# Patient Record
Sex: Male | Born: 1965 | Race: White | Hispanic: No | Smoking: Never smoker
Health system: Southern US, Community
[De-identification: ages and names within clinical notes are randomized; demographics above are authoritative.]

## PROBLEM LIST (undated history)

## (undated) DIAGNOSIS — Z5900 Homelessness unspecified: Secondary | ICD-10-CM

## (undated) DIAGNOSIS — E669 Obesity, unspecified: Secondary | ICD-10-CM

## (undated) DIAGNOSIS — M549 Dorsalgia, unspecified: Secondary | ICD-10-CM

## (undated) DIAGNOSIS — J449 Chronic obstructive pulmonary disease, unspecified: Secondary | ICD-10-CM

## (undated) DIAGNOSIS — M1990 Unspecified osteoarthritis, unspecified site: Secondary | ICD-10-CM

## (undated) DIAGNOSIS — F1910 Other psychoactive substance abuse, uncomplicated: Secondary | ICD-10-CM

## (undated) DIAGNOSIS — N200 Calculus of kidney: Secondary | ICD-10-CM

## (undated) DIAGNOSIS — L03116 Cellulitis of left lower limb: Secondary | ICD-10-CM

## (undated) DIAGNOSIS — R7303 Prediabetes: Secondary | ICD-10-CM

---

## 1998-09-22 ENCOUNTER — Emergency Department (HOSPITAL_COMMUNITY): Admission: EM | Admit: 1998-09-22 | Discharge: 1998-09-22 | Payer: Self-pay | Admitting: Emergency Medicine

## 1999-10-07 ENCOUNTER — Emergency Department (HOSPITAL_COMMUNITY): Admission: EM | Admit: 1999-10-07 | Discharge: 1999-10-07 | Payer: Self-pay | Admitting: Emergency Medicine

## 2001-01-07 ENCOUNTER — Emergency Department (HOSPITAL_COMMUNITY): Admission: EM | Admit: 2001-01-07 | Discharge: 2001-01-07 | Payer: Self-pay | Admitting: Emergency Medicine

## 2001-01-07 ENCOUNTER — Encounter: Payer: Self-pay | Admitting: Emergency Medicine

## 2001-01-24 ENCOUNTER — Encounter: Payer: Self-pay | Admitting: Emergency Medicine

## 2001-01-24 ENCOUNTER — Emergency Department (HOSPITAL_COMMUNITY): Admission: EM | Admit: 2001-01-24 | Discharge: 2001-01-24 | Payer: Self-pay | Admitting: Emergency Medicine

## 2001-03-11 ENCOUNTER — Encounter: Payer: Self-pay | Admitting: Emergency Medicine

## 2001-03-11 ENCOUNTER — Emergency Department (HOSPITAL_COMMUNITY): Admission: EM | Admit: 2001-03-11 | Discharge: 2001-03-11 | Payer: Self-pay | Admitting: Emergency Medicine

## 2001-03-14 ENCOUNTER — Emergency Department (HOSPITAL_COMMUNITY): Admission: EM | Admit: 2001-03-14 | Discharge: 2001-03-14 | Payer: Self-pay | Admitting: *Deleted

## 2001-03-20 ENCOUNTER — Encounter: Payer: Self-pay | Admitting: *Deleted

## 2001-03-20 ENCOUNTER — Emergency Department (HOSPITAL_COMMUNITY): Admission: EM | Admit: 2001-03-20 | Discharge: 2001-03-20 | Payer: Self-pay | Admitting: *Deleted

## 2001-04-03 ENCOUNTER — Encounter: Payer: Self-pay | Admitting: Family Medicine

## 2001-04-03 ENCOUNTER — Ambulatory Visit (HOSPITAL_COMMUNITY): Admission: RE | Admit: 2001-04-03 | Discharge: 2001-04-03 | Payer: Self-pay | Admitting: Family Medicine

## 2001-05-12 ENCOUNTER — Emergency Department (HOSPITAL_COMMUNITY): Admission: EM | Admit: 2001-05-12 | Discharge: 2001-05-12 | Payer: Self-pay | Admitting: Emergency Medicine

## 2001-05-12 ENCOUNTER — Encounter: Payer: Self-pay | Admitting: Emergency Medicine

## 2001-06-19 ENCOUNTER — Emergency Department (HOSPITAL_COMMUNITY): Admission: EM | Admit: 2001-06-19 | Discharge: 2001-06-19 | Payer: Self-pay | Admitting: Internal Medicine

## 2001-11-01 ENCOUNTER — Emergency Department (HOSPITAL_COMMUNITY): Admission: EM | Admit: 2001-11-01 | Discharge: 2001-11-01 | Payer: Self-pay | Admitting: *Deleted

## 2001-12-09 ENCOUNTER — Emergency Department (HOSPITAL_COMMUNITY): Admission: EM | Admit: 2001-12-09 | Discharge: 2001-12-09 | Payer: Self-pay | Admitting: Emergency Medicine

## 2001-12-18 ENCOUNTER — Emergency Department (HOSPITAL_COMMUNITY): Admission: EM | Admit: 2001-12-18 | Discharge: 2001-12-18 | Payer: Self-pay | Admitting: Emergency Medicine

## 2002-01-24 ENCOUNTER — Emergency Department (HOSPITAL_COMMUNITY): Admission: EM | Admit: 2002-01-24 | Discharge: 2002-01-24 | Payer: Self-pay | Admitting: Internal Medicine

## 2003-06-10 ENCOUNTER — Inpatient Hospital Stay (HOSPITAL_COMMUNITY): Admission: EM | Admit: 2003-06-10 | Discharge: 2003-06-15 | Payer: Self-pay | Admitting: Emergency Medicine

## 2003-06-10 ENCOUNTER — Encounter: Payer: Self-pay | Admitting: Emergency Medicine

## 2003-09-24 ENCOUNTER — Emergency Department (HOSPITAL_COMMUNITY): Admission: EM | Admit: 2003-09-24 | Discharge: 2003-09-24 | Payer: Self-pay | Admitting: Emergency Medicine

## 2005-07-21 ENCOUNTER — Emergency Department (HOSPITAL_COMMUNITY): Admission: EM | Admit: 2005-07-21 | Discharge: 2005-07-21 | Payer: Self-pay | Admitting: Emergency Medicine

## 2007-04-07 ENCOUNTER — Emergency Department (HOSPITAL_COMMUNITY): Admission: EM | Admit: 2007-04-07 | Discharge: 2007-04-07 | Payer: Self-pay | Admitting: Emergency Medicine

## 2007-08-17 ENCOUNTER — Emergency Department (HOSPITAL_COMMUNITY): Admission: EM | Admit: 2007-08-17 | Discharge: 2007-08-17 | Payer: Self-pay | Admitting: Emergency Medicine

## 2008-02-12 ENCOUNTER — Inpatient Hospital Stay (HOSPITAL_COMMUNITY): Admission: EM | Admit: 2008-02-12 | Discharge: 2008-02-16 | Payer: Self-pay | Admitting: Emergency Medicine

## 2008-07-03 ENCOUNTER — Emergency Department (HOSPITAL_COMMUNITY): Admission: EM | Admit: 2008-07-03 | Discharge: 2008-07-03 | Payer: Self-pay | Admitting: Emergency Medicine

## 2008-08-08 ENCOUNTER — Inpatient Hospital Stay (HOSPITAL_COMMUNITY): Admission: EM | Admit: 2008-08-08 | Discharge: 2008-08-09 | Payer: Self-pay | Admitting: Emergency Medicine

## 2008-09-01 ENCOUNTER — Inpatient Hospital Stay (HOSPITAL_COMMUNITY): Admission: EM | Admit: 2008-09-01 | Discharge: 2008-09-02 | Payer: Self-pay | Admitting: Emergency Medicine

## 2008-10-08 ENCOUNTER — Emergency Department (HOSPITAL_COMMUNITY): Admission: EM | Admit: 2008-10-08 | Discharge: 2008-10-08 | Payer: Self-pay | Admitting: Emergency Medicine

## 2009-01-30 ENCOUNTER — Emergency Department (HOSPITAL_COMMUNITY): Admission: EM | Admit: 2009-01-30 | Discharge: 2009-01-30 | Payer: Self-pay | Admitting: Emergency Medicine

## 2009-06-12 ENCOUNTER — Emergency Department (HOSPITAL_COMMUNITY): Admission: EM | Admit: 2009-06-12 | Discharge: 2009-06-12 | Payer: Self-pay | Admitting: Emergency Medicine

## 2009-07-03 ENCOUNTER — Emergency Department (HOSPITAL_COMMUNITY): Admission: EM | Admit: 2009-07-03 | Discharge: 2009-07-03 | Payer: Self-pay | Admitting: Emergency Medicine

## 2009-07-26 ENCOUNTER — Emergency Department (HOSPITAL_COMMUNITY): Admission: EM | Admit: 2009-07-26 | Discharge: 2009-07-26 | Payer: Self-pay | Admitting: Emergency Medicine

## 2009-08-23 ENCOUNTER — Emergency Department (HOSPITAL_COMMUNITY): Admission: EM | Admit: 2009-08-23 | Discharge: 2009-08-23 | Payer: Self-pay | Admitting: Emergency Medicine

## 2010-03-04 ENCOUNTER — Emergency Department (HOSPITAL_COMMUNITY): Admission: EM | Admit: 2010-03-04 | Discharge: 2010-03-04 | Payer: Self-pay | Admitting: Emergency Medicine

## 2010-10-11 ENCOUNTER — Inpatient Hospital Stay (HOSPITAL_COMMUNITY): Admission: EM | Admit: 2010-10-11 | Discharge: 2010-06-29 | Payer: Self-pay | Admitting: Emergency Medicine

## 2010-11-06 ENCOUNTER — Inpatient Hospital Stay (HOSPITAL_COMMUNITY): Admission: EM | Admit: 2010-11-06 | Discharge: 2010-11-08 | Payer: Self-pay | Source: Home / Self Care

## 2010-11-07 LAB — URINALYSIS, ROUTINE W REFLEX MICROSCOPIC
Bilirubin Urine: NEGATIVE
Hemoglobin, Urine: NEGATIVE
Ketones, ur: NEGATIVE mg/dL
Nitrite: NEGATIVE
Protein, ur: NEGATIVE mg/dL
Specific Gravity, Urine: >1.03 — ABNORMAL HIGH (ref 1.005–1.030)
Urine Glucose, Fasting: NEGATIVE mg/dL
Urobilinogen, UA: 0.2 mg/dL (ref 0.0–1.0)
pH: 5 (ref 5.0–8.0)

## 2010-11-07 LAB — RAPID URINE DRUG SCREEN, HOSP PERFORMED
Amphetamines: NOT DETECTED
Barbiturates: NOT DETECTED
Benzodiazepines: POSITIVE — AB
Cocaine: POSITIVE — AB
Opiates: POSITIVE — AB
Tetrahydrocannabinol: POSITIVE — AB

## 2010-11-07 LAB — VANCOMYCIN, TROUGH: Vancomycin Tr: 8.4 ug/mL — ABNORMAL LOW (ref 10.0–20.0)

## 2010-11-08 LAB — URINE CULTURE
Colony Count: NO GROWTH
Culture  Setup Time: 201201050112
Culture: NO GROWTH
Special Requests: NEGATIVE

## 2010-11-08 LAB — BASIC METABOLIC PANEL
BUN: 16 mg/dL (ref 6–23)
CO2: 25 mEq/L (ref 19–32)
Calcium: 8.4 mg/dL (ref 8.4–10.5)
Chloride: 106 mEq/L (ref 96–112)
Creatinine, Ser: 1.33 mg/dL (ref 0.4–1.5)
GFR calc Af Amer: >60 mL/min (ref >60)
GFR calc non Af Amer: 59 mL/min — ABNORMAL LOW (ref >60)
Glucose, Bld: 123 mg/dL — ABNORMAL HIGH (ref 70–99)
Potassium: 3.8 mEq/L (ref 3.5–5.1)
Sodium: 139 mEq/L (ref 135–145)

## 2010-11-08 LAB — DIFFERENTIAL
Basophils Absolute: 0 10*3/uL (ref 0.0–0.1)
Basophils Relative: 0 % (ref 0–1)
Eosinophils Absolute: 0.3 10*3/uL (ref 0.0–0.7)
Eosinophils Relative: 4 % (ref 0–5)
Lymphocytes Relative: 16 % (ref 12–46)
Lymphs Abs: 0.9 10*3/uL (ref 0.7–4.0)
Monocytes Absolute: 0.7 10*3/uL (ref 0.1–1.0)
Monocytes Relative: 12 % (ref 3–12)
Neutro Abs: 3.8 10*3/uL (ref 1.7–7.7)
Neutrophils Relative %: 67 % (ref 43–77)

## 2010-11-08 LAB — CBC
HCT: 33.7 % — ABNORMAL LOW (ref 39.0–52.0)
Hemoglobin: 11.2 g/dL — ABNORMAL LOW (ref 13.0–17.0)
MCH: 29.8 pg (ref 26.0–34.0)
MCHC: 33.2 g/dL (ref 30.0–36.0)
MCV: 89.6 fL (ref 78.0–100.0)
Platelets: 163 10*3/uL (ref 150–400)
RBC: 3.76 MIL/uL — ABNORMAL LOW (ref 4.22–5.81)
RDW: 14.1 % (ref 11.5–15.5)
WBC: 5.6 10*3/uL (ref 4.0–10.5)

## 2011-01-14 LAB — CULTURE, BLOOD (ROUTINE X 2)
Culture: NO GROWTH
Culture: NO GROWTH

## 2011-01-14 LAB — CBC
HCT: 43.2 % (ref 39.0–52.0)
MCH: 30.2 pg (ref 26.0–34.0)
Platelets: 204 10*3/uL (ref 150–400)
RBC: 4.87 MIL/uL (ref 4.22–5.81)
RDW: 13.6 % (ref 11.5–15.5)

## 2011-01-14 LAB — DIFFERENTIAL
Basophils Absolute: 0 10*3/uL (ref 0.0–0.1)
Basophils Relative: 0 % (ref 0–1)
Lymphocytes Relative: 3 % — ABNORMAL LOW (ref 12–46)
Lymphs Abs: 0.5 10*3/uL — ABNORMAL LOW (ref 0.7–4.0)
Monocytes Absolute: 0.6 10*3/uL (ref 0.1–1.0)
Neutrophils Relative %: 93 % — ABNORMAL HIGH (ref 43–77)

## 2011-01-14 LAB — URINALYSIS, ROUTINE W REFLEX MICROSCOPIC
Hgb urine dipstick: NEGATIVE
Nitrite: NEGATIVE
Specific Gravity, Urine: 1.02 (ref 1.005–1.030)
Urobilinogen, UA: 0.2 mg/dL (ref 0.0–1.0)

## 2011-01-14 LAB — BASIC METABOLIC PANEL
BUN: 13 mg/dL (ref 6–23)
CO2: 28 mEq/L (ref 19–32)
Chloride: 99 mEq/L (ref 96–112)
Creatinine, Ser: 1.33 mg/dL (ref 0.4–1.5)
Glucose, Bld: 99 mg/dL (ref 70–99)

## 2011-01-17 LAB — BASIC METABOLIC PANEL
BUN: 10 mg/dL (ref 6–23)
BUN: 9 mg/dL (ref 6–23)
CO2: 25 mEq/L (ref 19–32)
CO2: 26 mEq/L (ref 19–32)
CO2: 29 mEq/L (ref 19–32)
Calcium: 8.4 mg/dL (ref 8.4–10.5)
Calcium: 8.6 mg/dL (ref 8.4–10.5)
Chloride: 100 mEq/L (ref 96–112)
Chloride: 103 mEq/L (ref 96–112)
Chloride: 104 mEq/L (ref 96–112)
Creatinine, Ser: 1.16 mg/dL (ref 0.4–1.5)
Creatinine, Ser: 1.25 mg/dL (ref 0.4–1.5)
Creatinine, Ser: 1.25 mg/dL (ref 0.4–1.5)
GFR calc Af Amer: >60 mL/min (ref >60)
GFR calc Af Amer: >60 mL/min (ref >60)
GFR calc Af Amer: >60 mL/min (ref >60)
GFR calc non Af Amer: >60 mL/min (ref >60)
Potassium: 3.9 mEq/L (ref 3.5–5.1)
Sodium: 138 mEq/L (ref 135–145)

## 2011-01-17 LAB — CULTURE, BLOOD (ROUTINE X 2)
Culture: NO GROWTH
Culture: NO GROWTH
Report Status: 8262011
Report Status: 8262011

## 2011-01-17 LAB — CBC
Hemoglobin: 11.8 g/dL — ABNORMAL LOW (ref 13.0–17.0)
MCH: 29.9 pg (ref 26.0–34.0)
MCH: 30.2 pg (ref 26.0–34.0)
MCH: 30.5 pg (ref 26.0–34.0)
MCH: 30.7 pg (ref 26.0–34.0)
MCHC: 32.9 g/dL (ref 30.0–36.0)
MCV: 90 fL (ref 78.0–100.0)
MCV: 90.1 fL (ref 78.0–100.0)
MCV: 90.8 fL (ref 78.0–100.0)
MCV: 90.9 fL (ref 78.0–100.0)
Platelets: 149 10*3/uL — ABNORMAL LOW (ref 150–400)
Platelets: 154 10*3/uL (ref 150–400)
Platelets: 193 10*3/uL (ref 150–400)
RBC: 3.66 MIL/uL — ABNORMAL LOW (ref 4.22–5.81)
RBC: 3.86 MIL/uL — ABNORMAL LOW (ref 4.22–5.81)
RBC: 3.88 MIL/uL — ABNORMAL LOW (ref 4.22–5.81)
RDW: 14.2 % (ref 11.5–15.5)
RDW: 14.4 % (ref 11.5–15.5)
WBC: 6.5 10*3/uL (ref 4.0–10.5)
WBC: 8.1 10*3/uL (ref 4.0–10.5)

## 2011-01-17 LAB — DIFFERENTIAL
Basophils Absolute: 0 10*3/uL (ref 0.0–0.1)
Basophils Absolute: 0 10*3/uL (ref 0.0–0.1)
Basophils Relative: 0 % (ref 0–1)
Eosinophils Absolute: 0 10*3/uL (ref 0.0–0.7)
Eosinophils Absolute: 0 10*3/uL (ref 0.0–0.7)
Eosinophils Absolute: 0 10*3/uL (ref 0.0–0.7)
Eosinophils Relative: 0 % (ref 0–5)
Eosinophils Relative: 0 % (ref 0–5)
Eosinophils Relative: 0 % (ref 0–5)
Lymphocytes Relative: 14 % (ref 12–46)
Lymphocytes Relative: 16 % (ref 12–46)
Lymphs Abs: 1 10*3/uL (ref 0.7–4.0)
Lymphs Abs: 1 10*3/uL (ref 0.7–4.0)
Lymphs Abs: 1.1 10*3/uL (ref 0.7–4.0)
Monocytes Absolute: 0.7 10*3/uL (ref 0.1–1.0)
Monocytes Relative: 10 % (ref 3–12)
Neutro Abs: 6 10*3/uL (ref 1.7–7.7)
Neutrophils Relative %: 73 % (ref 43–77)
Neutrophils Relative %: 74 % (ref 43–77)
Neutrophils Relative %: 89 % — ABNORMAL HIGH (ref 43–77)
Neutrophils Relative %: 90 % — ABNORMAL HIGH (ref 43–77)

## 2011-01-17 LAB — LACTIC ACID, PLASMA: Lactic Acid, Venous: 3 mmol/L — ABNORMAL HIGH (ref 0.5–2.2)

## 2011-01-17 LAB — MRSA PCR SCREENING: MRSA by PCR: NEGATIVE

## 2011-01-17 LAB — VANCOMYCIN, TROUGH: Vancomycin Tr: 12.8 ug/mL (ref 10.0–20.0)

## 2011-01-17 LAB — PROCALCITONIN: Procalcitonin: <0.5 ng/mL

## 2011-01-22 LAB — URINALYSIS, ROUTINE W REFLEX MICROSCOPIC
Glucose, UA: NEGATIVE mg/dL
Hgb urine dipstick: NEGATIVE
Ketones, ur: NEGATIVE mg/dL
Protein, ur: NEGATIVE mg/dL

## 2011-02-07 LAB — URINALYSIS, ROUTINE W REFLEX MICROSCOPIC
Glucose, UA: NEGATIVE mg/dL
Ketones, ur: NEGATIVE mg/dL
Leukocytes, UA: NEGATIVE
Specific Gravity, Urine: 1.02 (ref 1.005–1.030)
pH: 5.5 (ref 5.0–8.0)

## 2011-02-07 LAB — URINE MICROSCOPIC-ADD ON

## 2011-02-07 LAB — DIFFERENTIAL
Basophils Absolute: 0 10*3/uL (ref 0.0–0.1)
Lymphocytes Relative: 16 % (ref 12–46)
Lymphs Abs: 1.3 10*3/uL (ref 0.7–4.0)
Neutro Abs: 6.1 10*3/uL (ref 1.7–7.7)
Neutrophils Relative %: 73 % (ref 43–77)

## 2011-02-07 LAB — BASIC METABOLIC PANEL
BUN: 16 mg/dL (ref 6–23)
Calcium: 9.1 mg/dL (ref 8.4–10.5)
Creatinine, Ser: 1.45 mg/dL (ref 0.4–1.5)
GFR calc non Af Amer: 53 mL/min — ABNORMAL LOW (ref >60)
Potassium: 3.8 mEq/L (ref 3.5–5.1)

## 2011-02-07 LAB — CBC
Platelets: 210 10*3/uL (ref 150–400)
RDW: 14.6 % (ref 11.5–15.5)
WBC: 8.4 10*3/uL (ref 4.0–10.5)

## 2011-02-08 LAB — RAPID STREP SCREEN (MED CTR MEBANE ONLY): Streptococcus, Group A Screen (Direct): NEGATIVE

## 2011-02-09 LAB — CULTURE, ROUTINE-ABSCESS: Gram Stain: NONE SEEN

## 2011-02-14 LAB — DIFFERENTIAL
Basophils Relative: 0 % (ref 0–1)
Eosinophils Absolute: 0 10*3/uL (ref 0.0–0.7)
Monocytes Absolute: 0.5 10*3/uL (ref 0.1–1.0)
Monocytes Relative: 8 % (ref 3–12)

## 2011-02-14 LAB — CBC
Hemoglobin: 13.3 g/dL (ref 13.0–17.0)
MCHC: 34.6 g/dL (ref 30.0–36.0)
MCV: 89.4 fL (ref 78.0–100.0)
RBC: 4.29 MIL/uL (ref 4.22–5.81)
WBC: 6.1 10*3/uL (ref 4.0–10.5)

## 2011-02-14 LAB — URINALYSIS, ROUTINE W REFLEX MICROSCOPIC
Glucose, UA: NEGATIVE mg/dL
Ketones, ur: NEGATIVE mg/dL
Leukocytes, UA: NEGATIVE
Protein, ur: NEGATIVE mg/dL
pH: 5.5 (ref 5.0–8.0)

## 2011-02-14 LAB — COMPREHENSIVE METABOLIC PANEL
ALT: 13 U/L (ref 0–53)
AST: 19 U/L (ref 0–37)
Albumin: 3.6 g/dL (ref 3.5–5.2)
Alkaline Phosphatase: 64 U/L (ref 39–117)
Potassium: 4.1 mEq/L (ref 3.5–5.1)
Sodium: 133 mEq/L — ABNORMAL LOW (ref 135–145)
Total Protein: 6.5 g/dL (ref 6.0–8.3)

## 2011-02-14 LAB — URINE MICROSCOPIC-ADD ON

## 2011-03-19 NOTE — H&P (Signed)
Albert Klein, Albert Klein                 ACCOUNT NO.:  [id number]   MEDICAL RECORD NO.:  [id number]          PATIENT TYPE:  INP   LOCATION:  A325                          FACILITY:  [facility]   PHYSICIAN:  Skeet Latch, DO    DATE OF BIRTH:  1966-10-14   DATE OF ADMISSION:  08/08/2008  DATE OF DISCHARGE:  LH                              HISTORY & PHYSICAL   PRIMARY CARE PHYSICIAN:  Dr. Olena Leatherwood.   CHIEF COMPLAINT:  Neck pain.   HISTORY OF PRESENT ILLNESS:  This is a 45 year old who presents with  complaint of neck pain and swelling.  Apparently, the patient states  that he noticed a pimple on the underside of his chin in the middle of  his neck yesterday.  The patient states that he burst the pimple and  then awoke in the middle of the morning, approximately 3 to 3:30 am with  severe pain, swelling, and redness to that area.  The patient states  that the pain is moderate to severe in nature.  The patient denied any  fevers or chills, or any other associated symptoms.  The patient then  states that he came to the emergency room to be evaluated.   PAST MEDICAL HISTORY:  Includes nephrolithiasis, chronic back pain, mild  asthma.   ALLERGIES:  PENICILLIN.  VICODIN/LORTAB/NORCO.   HOME MEDICATIONS:  1. Valium 10 mg twice daily.  2. Zyrtec 10 mg daily.  3. Percocet 10/650 mg 3 times a day.  4. Albuterol inhaler 2 puffs as needed.   SOCIAL HISTORY:  The patient denies any smoking or alcohol.  States that  he has used marijuana in the past.  None currently.   FAMILY HISTORY:  Positive for diabetes.   REVIEW OF SYSTEMS:  GENERAL:  No weight gain, weight loss, fevers or  chills.  HEENT:  Positive for some mid neck swelling, pain, and redness.  RESPIRATORY:  No cough, dyspnea, or wheezing.  CARDIOVASCULAR:  No chest  pain, palpitations.  GENITOURINARY:  No urgency, frequency.  MUSCULOSKELETAL:  Chronic back pain.  Other systems unremarkable.   PHYSICAL EXAM:  VITAL SIGNS:  Temperature is  98, pulse 71, respirations  18, blood pressure 115/66.  CONSTITUTIONAL:  Obese, well-nourished, well-hydrated, in no acute  distress.  HEENT:  Head is normocephalic, atraumatic.  No scleral icterus.  NECK:  Soft.  Does have some fullness in his thyroid area with slight  redness.  This is in the anterior midline of his neck.  ABDOMEN:  Obese, soft, nontender, nondistended.  Positive bowel sounds.  No rigidity or guarding.  EXTREMITIES:  Trace edema bilaterally, left greater than right.  NEURO:  Cranial nerves 2-12 grossly intact.  The patient moves all  extremities.  The patient is alert and oriented x3.   LABS:  Sodium 140, potassium 3.7, chloride 107, CO2 28, glucose 111, BUN  12, creatinine 1.27, white count is 7.5, hemoglobin 13.5, hematocrit  39.3, platelet count 189,000.   ASSESSMENT:  1. Cellulitis of the anterior midline of his neck.  2. History of morbid obesity.  3. History of chronic back  pain.   PLAN:  1. The patient admitted to the service of [facility] to general medical      bed.  2. For cellulitis, the patient has been started on vancomycin IV and      will continue that at this time.  The patient has ultrasound of his      neck, results are pending at this time.  3. Chronic back pain.  The patient will be placed on his home      medications, which include valium and Percocet, and the patient is      on Dilaudid IV.  4. For his obesity, the patient needs counseling regarding diet and      increased activity.  5. The patient will be on DVT as well as GI prophylaxis.      Skeet Latch, DO  Electronically Signed     SM/MEDQ  D:  08/08/2008  T:  08/08/2008  Job:  [id number]   cc:   Lia Hopping  Fax: [phone]

## 2011-03-19 NOTE — Group Therapy Note (Signed)
NAMEPAIGE, VANDERWOUDE                 ACCOUNT NO.:  [id number]   MEDICAL RECORD NO.:  [id number]          PATIENT TYPE:  INP   LOCATION:  A314                          FACILITY:  [facility]   PHYSICIAN:  Lucita Ferrara, MD         DATE OF BIRTH:  Nov 19, 1965   DATE OF PROCEDURE:  02/16/2008  DATE OF DISCHARGE:                                 PROGRESS NOTE   SUBJECTIVE:  The patient is still complaining of some pleuritic type  chest pain.  Still stats that he is wheezing and he shows me some blood-  tinged sputum.  He does not seem to be in any respiratory distress.  Per  nurse's comments and observations the patient seems to be stable on  ambulation.   OBJECTIVE:  The patient's temperature 97.2.  Pulse 59.  Respirations 20.  Blood pressure 137/78.  Pulse ox 96% on room air.  HEENT:  Normocephalic, atraumatic.  No JVD.  Neck supple.  LUNGS:  There is bilaterally expiratory wheezes that are much improved  since yesterday.  CARDIOVASCULAR:  S1 and S2.  Regular rate and rhythm.  No murmurs, rubs,  or clicks.  ABDOMEN:  Soft, nontender.  Not distended.  Positive bowel sounds.  EXTREMITIES:  No clubbing, cyanosis, or edema.  No erythema.  No lower  extremity swelling.   A complete metabolic panel is within normal limits, only with a mildly  low albumin of 2.9.  CBC is normal.  Serum culture is still pending.   ASSESSMENT AND PLAN:  1. Acute bronchitis with blood-tinged sputum and no massive      hemoptysis.  Again it is unlikely that his hemoptysis is secondary      to other worrisome causes such as tuberculosis, malignancy.  In      addition, the patient does not have signs or symptoms of pulmonary      embolism and he is nontachycardic.  He is not hypoxic.  He is to be      currently continued on aspirin and albuterol.  He is empirically      being treated with Levaquin, day #4.  He is currently on prednisone      60 daily.  Plan:  His clinical symptoms do not correlate with his      overall  picture.  He seemed clinically much better.  Will continue      on albuterol and Atrovent.  I am going to go ahead and ambulate him      and get a pulse oximetry in the hall.  Will await PPD results.      Will await sputum culture and cytology.  For completeness we may      decide to repeat a chest x-ray, PA and lateral, given the patient's      symptoms of nonimprovement.  2. Chronic back pain likely secondary to his obesity.  Again, continue      to ambulate the patient.  Get him out of bed.  Will institute      incentive spirometer.  This may also help open up  his airway.  Will      continue on albuterol.  3. Deep venous thrombosis prophylaxis currently with Lovenox 120 subcu      per weight.  Again, this is a pretty high dose.  May induce small      blood-tinged sputum theoretically, yet unlikely.  I feel that the      patient is pretty much ready to be discharged home.  I will go      ahead and put him on p.o. Levaquin today.  He needs to ambulate.      We can get rid of the Lovenox is he is ambulating properly.  He      does not qualify from home O2.      He may benefit from home nebulizer treatments.  Given that he is a      nonsmoker, I suspect that his acute asthmatic bronchitis may be      secondary to allergic etiology.  I will also go ahead and start him      on Singulair 10 mg p.o. nightly.  He should be ready to go home in      the next day or two.      Lucita Ferrara, MD  Electronically Signed     RR/MEDQ  D:  02/16/2008  T:  02/16/2008  Job:  [id number]

## 2011-03-19 NOTE — H&P (Signed)
NAMEJEOFFREY, Albert Klein                 ACCOUNT NO.:  [id number]   MEDICAL RECORD NO.:  [id number]          PATIENT TYPE:  INP   LOCATION:  A314                          FACILITY:  [facility]   PHYSICIAN:  Lucita Ferrara, MD         DATE OF BIRTH:  1966/06/15   DATE OF ADMISSION:  DATE OF DISCHARGE:  LH                              HISTORY & PHYSICAL   Generally speaking the patient feels okay. He is still wheezing. He is  still short of breath. Denies any active chest pain. There is still some  minimal hemoptysis noted.   PHYSICAL EXAMINATION:  VITAL SIGNS:  Vital signs show him to have  temperature of 97.1, and pulse is 59. Respirations are 20, and blood  pressure is 158/76. Pulse-ox is 96% on room air.  HEENT:  Normocephalic, atraumatic. Sclerae anicteric.  NECK:  Supple, no JVD.  LUNGS:  Expiratory wheezes noted in all lung fields.  CARDIOVASCULAR:  S1, S2, regular rate and rhythm. No murmurs, gallops,  or rubs.  ABDOMEN:  Soft, nontender, severely obese.   The patient had a chest x-ray most recently February 12, 2008, which shows  minimal chronic bronchiectatic changes. No acute abnormalities. The  patient has had a CBC with slightly high white count of 10.7, hemoglobin  13.8, hematocrit 40.5. His basic metabolic panel is within normal limits  with the exception of a mildly increased glucose of 152.   ASSESSMENT AND PLAN:  1. Acute bronchitis and hemoptysis which is not massive hemoptysis or      gross hemoptysis. It is most likely secondary to his bronchitis. He      is still wheezing, although he has no resting tachycardia and he is      not hypoxemic. I do not think he has TB as he has no signs and      symptoms of this. Just for completeness, I can get some sputum      cultures on him and sputum cytology, although this may not change      his course. Currently he is on Atrovent, albuterol. He is on      Lovenox 120 subcu q. 24 hours based on weight. He is also on      Atrovent, and he  is getting his day 3 of Levaquin. He is on      prednisone 60 mg daily. I will continue on with that and clinically      evaluate him.  2. Chronic back pain, most likely secondary to his morbid obesity.  3. Hemoptysis. No evidence of neoplasm. Hemoptysis is nonmassive and      mild, consistent with bronchitis. He is      low risk. We will continue to monitor and may consider outpatient      followup with pulmonologist if hemoptysis continues. I do not think      he would be compatible with a CT scan right now.   DISCHARGE DISPOSITION:  We may be able to discharge him in 1-2 days.      Lucita Ferrara, MD  Electronically Signed  RR/MEDQ  D:  02/15/2008  T:  02/15/2008  Job:  [id number]

## 2011-03-19 NOTE — Group Therapy Note (Signed)
NAMEBERTRAN, ZEIMET                 ACCOUNT NO.:  [id number]   MEDICAL RECORD NO.:  [id number]          PATIENT TYPE:  INP   LOCATION:  A314                          FACILITY:  [facility]   PHYSICIAN:  Osvaldo Shipper, MD     DATE OF BIRTH:  [birth year]   DATE OF PROCEDURE:  02/14/2008  DATE OF DISCHARGE:                                 PROGRESS NOTE   Subjectively, the patient feels better, still wheezing a little bit,  still short of breath.   OBJECTIVE:  VITAL SIGNS: Objectively, his vital signs are all stable.  Saturating 93% on room air.Marland Kitchen  LUNGS:  Still has diffuse expiratory wheezing bilaterally.  Though it  has improved .  CARDIOVASCULAR:  S1 and S2. Heart sounds regular.  No murmurs  appreciated.  ABDOMEN:  The patient is moderately obese.  Abdomen is soft.   LABORATORY DATA:  No new findings.   ASSESSMENT/PLAN:  1. Acute bronchitis.  He continues to have slight amount of      hemoptysis.  I have told him that this is secondary to his      bronchitis. Bronchitis is getting better.  In the meantime he is on      oral steroids.  Will change his nebulizers to q.6h. His peakflows      have been improving as well from 250 to 550.  2. Hemoptysis.  Chest x-ray did not show any evidence of a tumor.  If      hemoptysis does not resolve in the next few weeks, he may need to      be seen by a pulmonologist.  3. He has chronic back pain.  He has morbid obesity which are quite      stable.   As the patient continues to improve, I expect discharge tomorrow or the  day after.      Osvaldo Shipper, MD  Electronically Signed     GK/MEDQ  D:  02/14/2008  T:  02/14/2008  Job:  [id number]

## 2011-03-19 NOTE — Discharge Summary (Signed)
Albert Klein, Albert Klein                 ACCOUNT NO.:  [id number]   MEDICAL RECORD NO.:  [id number]          PATIENT TYPE:  INP   LOCATION:  A314                          FACILITY:  [facility]   PHYSICIAN:  Lucita Ferrara, MD         DATE OF BIRTH:  November 16, 1965   DATE OF ADMISSION:  02/12/2008  DATE OF DISCHARGE:  04/14/2009LH                               DISCHARGE SUMMARY   DISCHARGE DIAGNOSES:  1. Acute asthmatic bronchitis exacerbation.  2. Chronic back pain.  3. Morbid obesity.  4. Blood-tinged sputum.   PROCEDURES:  Patient had a chest x-ray dated February 12, 2008, which  showed minimal chronic bronchitic changes.  No acute abnormalities.  Patient also had an ABG done the same day which showed a normal PCO2 of  33.9, pH of 7.46, PAO2 of 70.3.  CBC and CMP within normal limits.   BRIEF HISTORY OF PRESENT ILLNESS AND HOSPITAL COURSE:  Albert Klein is a  morbidly obese male who presented to [name] on February 12, 2008, with cough, wheezing and shortness of breath.  He was  empirically started on nebulizer treatments, antibiotics with  fluoroquinolone, Levaquin, and prednisone for acute bronchitis and not  pneumonia, given no chest x-ray findings.  No fevers, chills or  consolidation.  With above measures, patient's symptoms improved slowly  and he was able to ambulate without any  shortness of breat or chest  pain.  For his hemoptysis that was only blood-tinged sputum, this was  really thought to be secondary to his acute bronchitis picture.  Patient  did not have any fevers, chills, signs of tuberculosis.  Patient also  did not have any resting tachycardia, hypoxemia, signs and symptoms of  pulmonary embolism.  Upon institution of nebulizer treatments, titrating  prednisone and broad spectrum antibiotics, patient's clinical symptoms  resolved.   DISCHARGE MEDICATIONS:  Patient is to go home on preadmission  medications including:  1. Percocet 10/650 every 8 hours as needed for  severe back pain.  2. Valium 10 mg p.o. b.i.d. as needed for muscle spasm.  3. Zyrtec 10 mg p.o. daily.  4. He is also going to be started on Singulair 10 mg p.o. q.h.s.  5. In addition, he will be taking Levaquin 500 mg p.o. daily x5 days.  6. In addition, he is going to go home with a prednisone Dosepak 10 mg      per instructions x6 days.  7. Also, patient is going to be going home with a metered-dose      inhaler, albuterol 5 mg to be used every 4 hours as needed   DISCHARGE CONDITION:  Stable.   DISCHARGE DIET:  Heart-healthy diet.   DISCHARGE ACTIVITIES:  Patient is to resume normal activity.  Increase  activity slowly.  Call with any chest pain or shortness of breath.   FOLLOW UP:  Patient is to follow up with primary care doctor in 48  hours to have his PPD read.  Note:  The patient may need to have a  referral to a pulmonologist in regards  to his blood-tinged sputum if  continues.      Lucita Ferrara, MD  Electronically Signed     RR/MEDQ  D:  02/16/2008  T:  02/16/2008  Job:  [id number]

## 2011-03-19 NOTE — Discharge Summary (Signed)
Albert Klein, Albert Klein                 ACCOUNT NO.:  [id number]   MEDICAL RECORD NO.:  [id number]          PATIENT TYPE:  INP   LOCATION:  A325                          FACILITY:  [facility]   PHYSICIAN:  Skeet Latch, DO    DATE OF BIRTH:  1966/02/04   DATE OF ADMISSION:  08/08/2008  DATE OF DISCHARGE:  10/06/2009LH                               DISCHARGE SUMMARY   DISCHARGE DIAGNOSES:  1. Neck cellulitis.  2. History of morbid obesity.  3. History of chronic back pain.   BRIEF HOSPITAL COURSE:  This is a 45 year old Caucasian male who  presented with neck pain and swelling.  Apparently, the patient noticed  a pimple on the underside of his chin in his anterior neck line 1 day  prior.  He burst the pimple and awoke in the middle of the morning with  severe pain, swelling and redness of the area.  Patient states the pain  was mild to severe, and he decided to come to the emergency room because  he had trouble swallowing.  Patient was seen and admitted for possible  cellulitis and abscess of his anterior midline of his neck.  Patient was  placed on IV antibiotics as well as IV fluids.  Patient was placed on  his home medications for his chronic back pain.  The patient had a chest  x-ray performed that showed no acute changes, showed low lung volumes  without focal airspace disease.  A PICC line was placed.  We were unsure  if patient was going to be on long-term IV antibiotics.  Patient's  initial labs showed white count of 7.5, hemoglobin 13.5, hematocrit  39.3, platelet count 189,000.  His basic metabolic panel was  unremarkable.  On the day of discharge, his white count is 6.8,  hemoglobin 12, hematocrit 35.1, platelet count 150, sodium 139,  potassium 3.6, chloride 105, CO2 of 27, glucose 96, BUN 10, creatinine  1.22.  Patient did undergo a soft tissue ultrasound of his neck which  showed findings compatible with cellulitis, no abscess.  At this time,  we feel patient is stable enough  to be sent home on IV antibiotics for  another 8 days.   MEDICATIONS ON DISCHARGE:  1. Valium 10 mg b.i.d.  2. Zyrtec 10 mg daily.  3. Percocet 10/650 t.i.d.  4. Albuterol 2 puffs inhaler as needed.  5. Vancomycin 1500 mg q.12 h. for 8 days.   CONDITION ON DISCHARGE:  Stable.   DISPOSITION:  The patient will be discharged home.   DISCHARGE INSTRUCTIONS:  The patient will be on low-salt, heart healthy  diet.  He is to keep the cellulitic area clean and dry.  Patient is to  increase activity slowly.  He is to follow up with Dr. Olena Leatherwood in the  next 2 days.   FOLLOWUP:  Patient is to take his medications as directed.  Patient is  to return to the emergency room if he has increasing swelling, pain,  redness or any problems with breathing or swallowing.  I believe patient  understands these instructions.  Again, patient will  be sent home on IV  antibiotics.  Patient does have a PICC line in place which needs to be  removed after his antibiotics are finished in 8 days.  Patient will be  sent home with home health also.      Skeet Latch, DO  Electronically Signed     SM/MEDQ  D:  08/09/2008  T:  08/09/2008  Job:  [id number]   cc:   Lia Hopping  Fax: [phone]

## 2011-03-19 NOTE — Discharge Summary (Signed)
NAMEGOLDEN, GILREATH NO.:  [id number]   MEDICAL RECORD NO.:  [id number]          PATIENT TYPE:  INP   LOCATION:  A311                          FACILITY:  [facility]   PHYSICIAN:  Skeet Latch, DO    DATE OF BIRTH:  May 26, 1966   DATE OF ADMISSION:  09/01/2008  DATE OF DISCHARGE:  10/30/2009LH                               DISCHARGE SUMMARY   DISCHARGE DIAGNOSES:  1. Right ankle/hip pain.  2. History of morbid obesity.  3. Chronic pain.   BRIEF HOSPITAL COURSE:  This 45 year old Caucasian, morbid obese man  presented with right ankle/hip pain status post fall.  The patient was  complaining of right ankle pain prior to this fall.  The patient states  that he lives on the second floor of hotel/motel and has had problems  getting to the hotel room secondary to the pain.  The patient had  multiple x-rays performed on his right hip and ankle.  No acute  fractures were identified.  The patient had physical therapy evaluate  the patient, who states that he will not weight bear on that ankle, but  there is nothing more that they can offer this patient at this time.  The patient was placed on his home medication which includes Percocet  his other home medications.  At this time, Social Services has contacted  the hotel.  The patient can have a first floor room.  The patient is  unwilling to pay the increased rate at this time.  At this time, we feel  the patient could be discharged with followup with his primary care  physician and we will order a wheelchair for the patient at this time.   MEDICATIONS ON DISCHARGE:  1. Percocet 10/650 mg q.6 h. p.r.n. pain.  2. Valium 10 mg twice a day.  3. Zyrtec 10 mg at bedtime.  4. Albuterol inhaler 2 puffs as needed.   Prescription for a large wheelchair will also be written for the  patient.   VITAL SIGNS ON DISCHARGE:  Temperature 98, pulse 76, respirations 16,  blood pressure 141/80, sating 97% on room air.   LABORATORY  DATA:  His uric acid is 6.8, sodium 132, potassium 3.6,  chloride 100, CO2 28, glucose 105, BUN 9, creatinine 1.29, white count  5.8, hemoglobin 12.2, hematocrit 35.3, and platelets 181,000.   CONDITION ON DISCHARGE:  Stable.   DISPOSITION:  Patient is being discharged to home.   DISCHARGE INSTRUCTIONS:  The patient to follow up with, I believe, Dr.  Olena Leatherwood within the next 3-5 days for close followup or in the next  scheduled appointment.  The patient to maintain a low-salt, heart-  healthy diet.  The patient will benefit with some type of weight loss  program and increase his exercise at this time.  The patient will  take his medications as prescribed.  I did speak with the patient  regarding his weight and contributory factor of his weight with his  pain, his chronic pain as well as his right ankle/hip pain.  The patient  may benefit from a more advanced  radiologic study possibly MRI if his  right ankle/foot pain does not improve.      Skeet Latch, DO  Electronically Signed     SM/MEDQ  D:  09/02/2008  T:  09/03/2008  Job:  [id number]   cc:   Lia Hopping  Fax: [phone]

## 2011-03-19 NOTE — H&P (Signed)
NAMEWHITMAN, MEINHARDT NO.:  [id number]   MEDICAL RECORD NO.:  [id number]          PATIENT TYPE:  INP   LOCATION:  A311                          FACILITY:  [facility]   PHYSICIAN:  Skeet Latch, DO    DATE OF BIRTH:  [birth year]   DATE OF ADMISSION:  09/01/2008  DATE OF DISCHARGE:  10/30/2009LH                              HISTORY & PHYSICAL   PRIMARY CARE PHYSICIAN:  Lia Hopping   CHIEF COMPLAINT:  Right upper foot pain.   HISTORY OF PRESENT ILLNESS:  This is a 45 year old morbidly obese  Caucasian male who presents after complaints of a fall.  The patient  states that he had a slight fall, injured his right ankle/foot prior  coming to the emergency room.  The patient was complaining of right  ankle/also right hip pain.  The patient states that his ankle is hurting  prior to his fall which occurred at home.  The patient lost his  consciousness, states the pain was 10/10, and states that the pain was  severe in nature.  The patient has had history of morbid obesity.   PAST MEDICAL HISTORY:  Mild asthma, chronic back pain, morbid obesity,  muscle spasms, kidney stones.   SURGICAL HISTORY:  None.   ALLERGIES:  PENICILLIN, LORTAB/NORCO.   HOME MEDICATIONS:  1. Percocet 10/650 mg 3 times a day.  2. Valium 10 mg twice a day.  3. Zyrtec 10 mg daily.  4. Albuterol inhaler 2 puffs as needed.   SOCIAL HISTORY:  The patient denies any alcohol, illicit drug use other  than marijuana, denies any smoking, alcohol.  He states that he has used  marijuana in the past.   FAMILY HISTORY:  Positive for diabetes.   REVIEW OF SYSTEMS:  GENERAL:  No weight gain, no fevers or chills.  HEENT: Unremarkable.  RESPIRATORY:  Unremarkable.  CARDIOVASCULAR:  Unremarkable.  MUSCULOSKELETAL:  Positive, chronic back pain, and right  ankle/foot pain.  All other systems are unremarkable.   PHYSICAL EXAMINATION:  VITAL SIGNS:  Temperature is 98.0, pulse 76,  respirations 16, blood  pressure 141/77, sating 97% on room air.  CONSTITUTIONAL:  He is obese, well-nourished, well-hydrated, no acute  distress.  HEENT: Normocephalic, atraumatic.  No scleral icterus.  NECK: Soft, supple, nontender, nondistended.  ABDOMEN:  Obese, soft, nontender, nondistended, positive bowel sounds,  no rigidity or guarding.  EXTREMITIES:  Trace edema bilaterally in his right ankle, pain on deep  palpation.  NEUROLOGIC:  Cranial nerves II through XII are grossly intact.  Patient  is alert and oriented x3.   LABORATORY:  Uric acid is 6.8, sodium 132, potassium 3.6, chloride 100,  CO2 is 28, glucose 105, BUN 90, creatinine 1.29.  White count is 5.8,  hemoglobin 12.2, hematocrit 35.3, platelet count is 181.   RADIOLOGIC STUDIES:  Right femur showed no skeletal abnormalities around  the femur, right hip joint intact, better visualized and early right hip  x-ray shows osteoarthritis of the right knee.  Hip x-ray, no gross  abnormalities, some optimal examination due to body habitus.  Right  ankle x-ray,  no acute skeletal abnormalities, mild degenerative changes  as described.   ASSESSMENT:  1. Right ankle and hip pain status post fall.  2. History of morbid obesity.  3. History of chronic back pain.   PLAN:  1. The patient admitted to the service of [facility], general medical      bed.  2. For his ankle pain, the patient was placed on his home medication      which included a Percocet.  X-rays were obtained.  No acute      fractures were noticed.  Will get physical therapy to evaluate the      patient.  3. For his morbid obesity, slight anxiety.  The patient will be placed      on his home medications.  4. The patient will be placed on breathing treatments, keep his sats      above greater than 92%.  5. We will have Social Services evaluate for home health, as well as      some rehab potential.  Lastly, the patient made DVT, as well as GI      prophylaxis.      Skeet Latch,  DO  Electronically Signed     SM/MEDQ  D:  09/02/2008  T:  09/03/2008  Job:  [id number]
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
## 2011-03-19 NOTE — Group Therapy Note (Signed)
NAMEGARRETTE, CAINE NO.:  [id number]   MEDICAL RECORD NO.:  [id number]          PATIENT TYPE:  INP   LOCATION:  A314                          FACILITY:  [facility]   PHYSICIAN:  Gardiner Barefoot, MD    DATE OF BIRTH:  26-Feb-1966   DATE OF PROCEDURE:  02/13/2008  DATE OF DISCHARGE:                                 PROGRESS NOTE   SUBJECTIVE:  The patient reports subjective feelings of continued  wheezing.  The patient also reports that his back pain is continually  hurting him.  Denies any chest pain or other pain.   PHYSICAL EXAMINATION:  VITAL SIGNS:  Temperature is 97.1, pulse is 61,  respirations 20, blood pressure is 122/668, and O2 saturation is 97% on  room air.  GENERAL:  The patient is awake and alert and appears mildly anxious.  CARDIOVASCULAR:  Distant, regular rate and rhythm, and no murmurs, rubs,  or gallops appreciated.  LUNGS:  Diffuse mild wheezes.  ABDOMEN:  Morbidly obese, soft, nontender, nondistended, with positive  bowel sounds, and no hepatosplenomegaly.  EXTREMITIES:  With no edema.   LABORATORY DATA:  No new labs today.   ASSESSMENT AND PLAN:  1. Asthma exacerbation.  The patient appears to be doing well,      responding to inhalers.  We will continue steroids.  However, we      will change them to p.o. steroids with prednisone 60 mg.  We will      also continue with the nebulized treatments and Levaquin for the      report of bronchitic changes on the x-ray.  The patient does deny a      history of smoking.  2. CBG.  The blood glucose has ranged from 114-204.  This is secondary      to his steroids.  3. Chronic pain.  The patient reports continued back pain.  However,      this is the same pain he has as an outpatient.  I discussed the      patient that we will continue with his home pain medication      regimen.      Gardiner Barefoot, MD  Electronically Signed     RWC/MEDQ  D:  02/13/2008  T:  02/13/2008  Job:  [id number]
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
## 2011-06-23 ENCOUNTER — Emergency Department (HOSPITAL_COMMUNITY): Payer: Medicaid Other

## 2011-06-23 ENCOUNTER — Encounter: Payer: Self-pay | Admitting: Emergency Medicine

## 2011-06-23 ENCOUNTER — Emergency Department (HOSPITAL_COMMUNITY)
Admission: EM | Admit: 2011-06-23 | Discharge: 2011-06-23 | Disposition: A | Discharge status: Home / Self Care | Payer: Medicaid Other | Attending: Emergency Medicine | Admitting: Emergency Medicine

## 2011-06-23 DIAGNOSIS — R609 Edema, unspecified: Secondary | ICD-10-CM | POA: Insufficient documentation

## 2011-06-23 DIAGNOSIS — J45909 Unspecified asthma, uncomplicated: Secondary | ICD-10-CM | POA: Insufficient documentation

## 2011-06-23 DIAGNOSIS — M79609 Pain in unspecified limb: Secondary | ICD-10-CM | POA: Insufficient documentation

## 2011-06-23 DIAGNOSIS — L03116 Cellulitis of left lower limb: Secondary | ICD-10-CM

## 2011-06-23 HISTORY — DX: Dorsalgia, unspecified: M54.9

## 2011-06-23 LAB — CBC
MCH: 30.3 pg (ref 26.0–34.0)
MCV: 91 fL (ref 78.0–100.0)
Platelets: 153 10*3/uL (ref 150–400)
RBC: 4.89 MIL/uL (ref 4.22–5.81)
RDW: 13.9 % (ref 11.5–15.5)

## 2011-06-23 LAB — BASIC METABOLIC PANEL
Calcium: 9.8 mg/dL (ref 8.4–10.5)
Creatinine, Ser: 1.26 mg/dL (ref 0.50–1.35)
GFR calc non Af Amer: >60 mL/min (ref >60)
Glucose, Bld: 89 mg/dL (ref 70–99)
Sodium: 139 mEq/L (ref 135–145)

## 2011-06-23 LAB — DIFFERENTIAL
Eosinophils Absolute: 0 10*3/uL (ref 0.0–0.7)
Eosinophils Relative: 0 % (ref 0–5)
Lymphs Abs: 0.4 10*3/uL — ABNORMAL LOW (ref 0.7–4.0)
Monocytes Absolute: 0.2 10*3/uL (ref 0.1–1.0)

## 2011-06-23 MED ORDER — ENOXAPARIN SODIUM 80 MG/0.8ML ~~LOC~~ SOLN
SUBCUTANEOUS | Status: AC
  Filled 2011-06-23: qty 0.8

## 2011-06-23 MED ORDER — SULFAMETHOXAZOLE-TRIMETHOPRIM 800-160 MG PO TABS: 1.0000 | ORAL_TABLET | Freq: Two times a day (BID) | ORAL | Status: DC

## 2011-06-23 MED ORDER — SODIUM CHLORIDE 0.9 % IV SOLN: Freq: Once | INTRAVENOUS | Status: DC

## 2011-06-23 MED ORDER — ACETAMINOPHEN 500 MG PO TABS
1000.0000 mg | ORAL_TABLET | Freq: Once | ORAL | Status: AC
  Administered 2011-06-23: 1000 mg via ORAL
  Filled 2011-06-23: qty 2

## 2011-06-23 MED ORDER — HYDROMORPHONE HCL 1 MG/ML IJ SOLN: 2.0000 mg | Freq: Once | INTRAMUSCULAR | Status: DC

## 2011-06-23 MED ORDER — ENOXAPARIN SODIUM 100 MG/ML ~~LOC~~ SOLN
SUBCUTANEOUS | Status: AC
  Filled 2011-06-23: qty 2

## 2011-06-23 MED ORDER — HYDROMORPHONE HCL 2 MG/ML IJ SOLN
INTRAMUSCULAR | Status: AC
  Administered 2011-06-23: 2 mg via INTRAVENOUS
  Filled 2011-06-23: qty 1

## 2011-06-23 MED ORDER — ENOXAPARIN SODIUM 80 MG/0.8ML ~~LOC~~ SOLN
210.0000 mg | Freq: Once | SUBCUTANEOUS | Status: AC
  Administered 2011-06-23: 210 mg via SUBCUTANEOUS

## 2011-06-23 MED ORDER — OXYCODONE-ACETAMINOPHEN 5-325 MG PO TABS: 1.0000 | ORAL_TABLET | Freq: Four times a day (QID) | ORAL | Status: DC | PRN

## 2011-06-23 MED ORDER — SULFAMETHOXAZOLE-TMP DS 800-160 MG PO TABS
1.0000 | ORAL_TABLET | Freq: Once | ORAL | Status: AC
  Administered 2011-06-23: 1 via ORAL
  Filled 2011-06-23: qty 1

## 2011-06-23 MED ORDER — SODIUM CHLORIDE 0.9 % IV BOLUS (SEPSIS)
1000.0000 mL | Freq: Once | INTRAVENOUS | Status: AC
  Administered 2011-06-23: 1000 mL via INTRAVENOUS

## 2011-06-23 MED ORDER — HYDROMORPHONE HCL 1 MG/ML IJ SOLN
1.0000 mg | Freq: Once | INTRAMUSCULAR | Status: AC
  Administered 2011-06-23: 1 mg via INTRAVENOUS
  Filled 2011-06-23: qty 1

## 2011-06-23 MED ORDER — ONDANSETRON HCL 4 MG/2ML IJ SOLN
4.0000 mg | Freq: Once | INTRAMUSCULAR | Status: AC
  Administered 2011-06-23: 4 mg via INTRAVENOUS
  Filled 2011-06-23: qty 2

## 2011-06-23 NOTE — ED Notes (Signed)
Left leg cellulitis

## 2011-06-23 NOTE — ED Provider Notes (Signed)
History     CSN: [id number] Arrival date & time: 06/23/2011  3:23 AM  Chief Complaint  Patient presents with  . Leg Pain   HPI Comments: Seen [id number]. Patient with morbid obesity (470 pounds approx) here with left lower leg pain that radiates up his leg. 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 States he has had cellulitis in this leg before and feels the pain is similar. Denies fever, chills, numbness, tingling, chest pain, shortness of breath. States pain is a deep pain that radiates up his leg into his thigh.   Patient is a 45 y.o. male presenting with leg pain. The history is provided by the patient.  Leg Pain  The incident occurred yesterday. The pain is present in the left leg. The pain is at a severity of 9/10. The pain is severe. The pain has been constant since onset. Pertinent negatives include no numbness, no loss of motion, no muscle weakness, no loss of sensation and no tingling. The symptoms are aggravated by bearing weight. He has tried nothing for the symptoms.    Past Medical History  Diagnosis Date  . Back pain   . Asthma     History reviewed. No pertinent past surgical history.  History reviewed. No pertinent family history.  History  Substance Use Topics  . Smoking status: Never Smoker   . Smokeless tobacco: Not on file  . Alcohol Use: 1.8 oz/week    3 Cans of beer per week      Review of Systems  HENT: Negative for neck pain.   Respiratory: Negative for apnea, cough, chest tightness, shortness of breath and wheezing.   Cardiovascular: Negative for chest pain.       Chronic lower extremity edema  Neurological: Negative for tingling, weakness and numbness.  All other systems reviewed and are negative.    Physical Exam  BP 111/46  Pulse 132  Temp(Src) 98.7 F (37.1 C) (Oral)  Resp 17  Ht 5\' 9"  (1.753 m)  Wt 470 lb (213.191 kg)  BMI 69.41 kg/m2  SpO2 98%  Physical  Exam  Nursing note and vitals reviewed. Constitutional: He is oriented to person, place, and time. He appears well-developed and well-nourished. He appears distressed.       Morbidly obese  HENT:  Head: Normocephalic and atraumatic.  Mouth/Throat: Oropharynx is clear and moist.  Eyes: EOM are normal.  Neck: Normal range of motion. Neck supple.  Cardiovascular: Normal rate, normal heart sounds and intact distal pulses.   Pulmonary/Chest: Effort normal and breath sounds normal.  Abdominal: Soft.       Morbidly obese  Musculoskeletal:       3+ lower extremity chronic edema. Left leg with warmth and mild erythema from ankle to knee. Unable to adequately test homan's sign.  Neurological: He is alert and oriented to person, place, and time. He has normal reflexes.  Skin:       Chronic skin changes to lower extremities    ED Course  Procedures  MDM MDM Reviewed: nursing note and vitals Interpretation: labs Total time providing critical care: 30-74 minutes. This excludes time spent performing separately reportable procedures and services.    Patient is morbidly obese sedentary man with pain and swelling to his left leg. Worrisome for cellulitis and DVT/PE. D-dimer positive with no chest symptoms. Patient is too heavy for CT scanner. Lovenox given pending [place] of lower extremity.  Pain is deep pain which would be more indicative of DVT than a cellulitis while the lower leg is warm and has an underlying erythema.Pain improved with analgesics.Pt stable in ED with no significant deterioration in condition. 1610.Patient discussed and turned over to Dr. Karma Ganja for care/disposition.      Nicoletta Dress. Colon Branch, MD 06/23/11 [phone]

## 2011-06-23 NOTE — ED Notes (Signed)
Pt still c/o pain but dozing at intervals. Awaiting [place] to be done at 9:00. Pt given gingerale x 2.

## 2011-06-23 NOTE — ED Notes (Signed)
Portable US completed in room

## 2011-06-23 NOTE — ED Provider Notes (Signed)
9:53 AM Pt signed out to me pending DVT ultrasound.  No sign of DVT.  Pt with low grade fever, plan is to treat him for cellulitis, will start bactrim in ED and discharge.  Pt has no PMD and no access to care, so will have him return in 24-48 hours for a recheck.  Tachycardia has improved from initial vitals.   Ethelda Chick, MD 06/23/11 [phone]

## 2011-06-23 NOTE — ED Notes (Signed)
Pt stating left lower leg pain stating he has cellulitis, no redness , no swelling noted

## 2011-06-23 NOTE — ED Notes (Signed)
Pt stated if he doesn't have cellulitis in his leg maybe his kidney stones are hurting his leg.

## 2011-06-23 NOTE — ED Notes (Signed)
Pt self ambulated with a  Steady gait to rest room and back

## 2011-06-25 ENCOUNTER — Encounter (HOSPITAL_COMMUNITY): Payer: Self-pay | Admitting: *Deleted

## 2011-06-25 ENCOUNTER — Inpatient Hospital Stay (HOSPITAL_COMMUNITY)
Admission: EM | Admit: 2011-06-25 | Discharge: 2011-07-01 | DRG: 603 | Disposition: A | Discharge status: Home / Self Care | Payer: Medicaid Other | Attending: Internal Medicine | Admitting: Internal Medicine

## 2011-06-25 DIAGNOSIS — E871 Hypo-osmolality and hyponatremia: Secondary | ICD-10-CM | POA: Diagnosis present

## 2011-06-25 DIAGNOSIS — F1210 Cannabis abuse, uncomplicated: Secondary | ICD-10-CM | POA: Diagnosis present

## 2011-06-25 DIAGNOSIS — J45909 Unspecified asthma, uncomplicated: Secondary | ICD-10-CM | POA: Diagnosis present

## 2011-06-25 DIAGNOSIS — L02419 Cutaneous abscess of limb, unspecified: Principal | ICD-10-CM | POA: Diagnosis present

## 2011-06-25 DIAGNOSIS — F1910 Other psychoactive substance abuse, uncomplicated: Secondary | ICD-10-CM | POA: Diagnosis present

## 2011-06-25 DIAGNOSIS — M549 Dorsalgia, unspecified: Secondary | ICD-10-CM | POA: Diagnosis present

## 2011-06-25 DIAGNOSIS — D529 Folate deficiency anemia, unspecified: Secondary | ICD-10-CM | POA: Diagnosis present

## 2011-06-25 DIAGNOSIS — D509 Iron deficiency anemia, unspecified: Secondary | ICD-10-CM | POA: Diagnosis present

## 2011-06-25 DIAGNOSIS — E876 Hypokalemia: Secondary | ICD-10-CM | POA: Diagnosis present

## 2011-06-25 DIAGNOSIS — Z5900 Homelessness unspecified: Secondary | ICD-10-CM

## 2011-06-25 DIAGNOSIS — K219 Gastro-esophageal reflux disease without esophagitis: Secondary | ICD-10-CM | POA: Diagnosis present

## 2011-06-25 DIAGNOSIS — Z6841 Body Mass Index (BMI) 40.0 and over, adult: Secondary | ICD-10-CM

## 2011-06-25 DIAGNOSIS — E538 Deficiency of other specified B group vitamins: Secondary | ICD-10-CM | POA: Diagnosis present

## 2011-06-25 DIAGNOSIS — L03119 Cellulitis of unspecified part of limb: Secondary | ICD-10-CM

## 2011-06-25 DIAGNOSIS — F1410 Cocaine abuse, uncomplicated: Secondary | ICD-10-CM | POA: Diagnosis present

## 2011-06-25 DIAGNOSIS — F1110 Opioid abuse, uncomplicated: Secondary | ICD-10-CM | POA: Diagnosis present

## 2011-06-25 DIAGNOSIS — D696 Thrombocytopenia, unspecified: Secondary | ICD-10-CM | POA: Diagnosis present

## 2011-06-25 DIAGNOSIS — Z765 Malingerer [conscious simulation]: Secondary | ICD-10-CM

## 2011-06-25 DIAGNOSIS — N179 Acute kidney failure, unspecified: Secondary | ICD-10-CM | POA: Diagnosis present

## 2011-06-25 DIAGNOSIS — D649 Anemia, unspecified: Secondary | ICD-10-CM | POA: Diagnosis present

## 2011-06-25 DIAGNOSIS — R946 Abnormal results of thyroid function studies: Secondary | ICD-10-CM | POA: Diagnosis present

## 2011-06-25 HISTORY — DX: Calculus of kidney: N20.0

## 2011-06-25 HISTORY — DX: Other psychoactive substance abuse, uncomplicated: F19.10

## 2011-06-25 HISTORY — DX: Homelessness: Z59.0

## 2011-06-25 HISTORY — DX: Cellulitis of left lower limb: L03.116

## 2011-06-25 HISTORY — DX: Obesity, unspecified: E66.9

## 2011-06-25 HISTORY — DX: Homelessness unspecified: Z59.00

## 2011-06-25 LAB — BASIC METABOLIC PANEL
BUN: 24 mg/dL — ABNORMAL HIGH (ref 6–23)
Calcium: 8.5 mg/dL (ref 8.4–10.5)
Creatinine, Ser: 1.51 mg/dL — ABNORMAL HIGH (ref 0.50–1.35)
GFR calc Af Amer: >60 mL/min (ref >60)
GFR calc non Af Amer: 50 mL/min — ABNORMAL LOW (ref >60)
Glucose, Bld: 105 mg/dL — ABNORMAL HIGH (ref 70–99)

## 2011-06-25 LAB — CBC
MCH: 30.3 pg (ref 26.0–34.0)
MCHC: 33.9 g/dL (ref 30.0–36.0)
MCV: 89.6 fL (ref 78.0–100.0)
Platelets: 119 10*3/uL — ABNORMAL LOW (ref 150–400)
RDW: 14.1 % (ref 11.5–15.5)
WBC: 12.6 10*3/uL — ABNORMAL HIGH (ref 4.0–10.5)

## 2011-06-25 LAB — DIFFERENTIAL
Basophils Absolute: 0 10*3/uL (ref 0.0–0.1)
Eosinophils Absolute: 0.1 10*3/uL (ref 0.0–0.7)
Lymphs Abs: 0.6 10*3/uL — ABNORMAL LOW (ref 0.7–4.0)
Monocytes Absolute: 0.3 10*3/uL (ref 0.1–1.0)
Monocytes Relative: 2 % — ABNORMAL LOW (ref 3–12)
Neutro Abs: 11.6 10*3/uL — ABNORMAL HIGH (ref 1.7–7.7)

## 2011-06-25 LAB — RAPID URINE DRUG SCREEN, HOSP PERFORMED
Amphetamines: NOT DETECTED
Barbiturates: NOT DETECTED
Opiates: POSITIVE — AB
Tetrahydrocannabinol: POSITIVE — AB

## 2011-06-25 MED ORDER — DIAZEPAM 5 MG PO TABS
10.0000 mg | ORAL_TABLET | Freq: Three times a day (TID) | ORAL | Status: DC
  Administered 2011-06-25 – 2011-07-01 (×17): 10 mg via ORAL
  Filled 2011-06-25 (×17): qty 2

## 2011-06-25 MED ORDER — SODIUM CHLORIDE 0.9 % IJ SOLN
3.0000 mL | INTRAMUSCULAR | Status: DC | PRN
  Filled 2011-06-25: qty 3

## 2011-06-25 MED ORDER — PANTOPRAZOLE SODIUM 40 MG PO TBEC
40.0000 mg | DELAYED_RELEASE_TABLET | Freq: Every day | ORAL | Status: DC
  Administered 2011-06-25 – 2011-06-30 (×6): 40 mg via ORAL
  Filled 2011-06-25 (×7): qty 1

## 2011-06-25 MED ORDER — OXYCODONE HCL 5 MG PO TABS
5.0000 mg | ORAL_TABLET | ORAL | Status: DC | PRN
  Administered 2011-06-25 – 2011-06-30 (×16): 5 mg via ORAL
  Filled 2011-06-25 (×18): qty 1

## 2011-06-25 MED ORDER — ACETAMINOPHEN 650 MG RE SUPP: 650.0000 mg | Freq: Four times a day (QID) | RECTAL | Status: DC | PRN

## 2011-06-25 MED ORDER — SODIUM CHLORIDE 0.9 % IV SOLN
INTRAVENOUS | Status: AC
  Administered 2011-06-25: 18:00:00 via INTRAVENOUS

## 2011-06-25 MED ORDER — HYDROMORPHONE HCL 1 MG/ML IJ SOLN
2.0000 mg | INTRAMUSCULAR | Status: DC | PRN
  Administered 2011-06-25: 1 mg via INTRAVENOUS
  Administered 2011-06-26 – 2011-06-30 (×22): 2 mg via INTRAVENOUS
  Administered 2011-06-30 (×2): 1 mg via INTRAVENOUS
  Administered 2011-06-30: 2 mg via INTRAVENOUS
  Filled 2011-06-25 (×12): qty 2
  Filled 2011-06-25: qty 1
  Filled 2011-06-25 (×2): qty 2
  Filled 2011-06-25: qty 1
  Filled 2011-06-25 (×5): qty 2
  Filled 2011-06-25: qty 1
  Filled 2011-06-25: qty 2
  Filled 2011-06-25: qty 1
  Filled 2011-06-25 (×2): qty 2
  Filled 2011-06-25: qty 1

## 2011-06-25 MED ORDER — POTASSIUM CHLORIDE CRYS ER 20 MEQ PO TBCR
40.0000 meq | EXTENDED_RELEASE_TABLET | Freq: Once | ORAL | Status: AC
  Administered 2011-06-25: 40 meq via ORAL
  Filled 2011-06-25: qty 2

## 2011-06-25 MED ORDER — MORPHINE SULFATE 4 MG/ML IJ SOLN
4.0000 mg | Freq: Once | INTRAMUSCULAR | Status: AC
  Administered 2011-06-25: 4 mg via INTRAVENOUS
  Filled 2011-06-25: qty 1

## 2011-06-25 MED ORDER — MOXIFLOXACIN HCL IN NACL 400 MG/250ML IV SOLN
INTRAVENOUS | Status: AC
  Filled 2011-06-25: qty 250

## 2011-06-25 MED ORDER — HYDROMORPHONE HCL 1 MG/ML IJ SOLN
1.0000 mg | INTRAMUSCULAR | Status: DC | PRN
  Administered 2011-06-25: 1 mg via INTRAVENOUS
  Filled 2011-06-25: qty 1

## 2011-06-25 MED ORDER — ACETAMINOPHEN 325 MG PO TABS
650.0000 mg | ORAL_TABLET | Freq: Four times a day (QID) | ORAL | Status: DC | PRN
  Administered 2011-06-25 – 2011-07-01 (×7): 650 mg via ORAL
  Filled 2011-06-25 (×7): qty 2

## 2011-06-25 MED ORDER — LORATADINE 10 MG PO TABS
10.0000 mg | ORAL_TABLET | Freq: Every day | ORAL | Status: DC
  Administered 2011-06-25 – 2011-07-01 (×7): 10 mg via ORAL
  Filled 2011-06-25 (×7): qty 1

## 2011-06-25 MED ORDER — VANCOMYCIN HCL IN DEXTROSE 1-5 GM/200ML-% IV SOLN
1000.0000 mg | Freq: Once | INTRAVENOUS | Status: AC
  Administered 2011-06-25: 1000 mg via INTRAVENOUS
  Filled 2011-06-25: qty 200

## 2011-06-25 MED ORDER — SODIUM CHLORIDE 0.9 % IV SOLN
INTRAVENOUS | Status: DC
  Administered 2011-06-25 – 2011-06-26 (×3): via INTRAVENOUS

## 2011-06-25 MED ORDER — MOXIFLOXACIN HCL IN NACL 400 MG/250ML IV SOLN
400.0000 mg | INTRAVENOUS | Status: DC
  Administered 2011-06-25: 400 mg via INTRAVENOUS
  Filled 2011-06-25: qty 250

## 2011-06-25 MED ORDER — ALBUTEROL SULFATE HFA 108 (90 BASE) MCG/ACT IN AERS: 2.0000 | INHALATION_SPRAY | Freq: Four times a day (QID) | RESPIRATORY_TRACT | Status: DC | PRN

## 2011-06-25 MED ORDER — ENOXAPARIN SODIUM 80 MG/0.8ML ~~LOC~~ SOLN
40.0000 mg | SUBCUTANEOUS | Status: DC
  Administered 2011-06-25: 40 mg via SUBCUTANEOUS
  Filled 2011-06-25: qty 0.8

## 2011-06-25 NOTE — ED Notes (Signed)
Pt has blisters noted on the back of his left leg. Serous drainage noted from 1 of the blisters that has popped.

## 2011-06-25 NOTE — ED Notes (Signed)
Pt c/o increased pain and swelling to his left lower extremity. Pt was seen in the ED on Sunday. States that he got his antibiotics filled and has been taking them. Pt's left lower extremity very swollen, red and warm to the touch.

## 2011-06-25 NOTE — H&P (Signed)
PCP:   HASANAJ,XAJE A, MD   Chief Complaint:  Pain and redness of left leg.  HPI: Patient is a 45 year old white male with past medical history of polysubstance abuse and severe morbid obesity who has had episodes of recurrent cellulitis of his lower extremities in the past. His last episode was approximately 8-9 months ago when he was hospitalized for the same.  Patient woke up 3 days ago with increased pain and redness and swelling of his left calf. Immediately came in the emergency room for evaluation. At that time his white count was minimally elevated the redness was minimal and he was discharged home with PO Bactrim. This is the same antibiotic that he was discharged home from his previous hospitalization in January.  The patient return back today complaining of increased redness and pain.  At this point his white blood cell count had increased to 12.6 with 92% shift. In addition his creatinine had elevated to 1.51. Patient was started on IV antibiotics and IV fluids. It is of note that a lower extremity Doppler done a few days ago showed no evidence of DVT.  Review of Systems:  The patient denies headaches, vision changes, dysphasia, chest pain, palpitations, shortness of breath, wheeze, cough, abdominal pain, hematuria, dysuria, constipation, diarrhea, focal extremity numbness. The patient does complain of left calf pain and swelling and redness. He complains of chronic lower back pain. Review of systems otherwise negative.  Past Medical History: Past Medical History  Diagnosis Date  . Back pain   . Asthma    morbid obesity Previous episodes of cellulitis of the lower extremities Polysubstance abuse including positive urine drug screens for cocaine and THC.  History reviewed. No pertinent past surgical history.  Medications: Prior to Admission medications   Medication Sig Start Date End Date Taking? Authorizing Provider  albuterol (PROVENTIL,VENTOLIN) 90 MCG/ACT inhaler Inhale 2  puffs into the lungs every 6 (six) hours as needed. Wheezing    Yes Historical Provider, MD  cetirizine (ZYRTEC) 10 MG tablet Take 10 mg by mouth daily.     Yes Historical Provider, MD  diazepam (VALIUM) 10 MG tablet Take 10 mg by mouth 3 (three) times daily. For anxiety   Yes Historical Provider, MD  ibuprofen (ADVIL,MOTRIN) 200 MG tablet Take 400 mg by mouth every 6 (six) hours as needed. Pain    Yes Historical Provider, MD  omeprazole (PRILOSEC) 20 MG capsule Take 20 mg by mouth daily.     Yes Historical Provider, MD  oxyCODONE-acetaminophen (PERCOCET) 10-650 MG per tablet Take 1 tablet by mouth every 6 (six) hours as needed. Pain    Yes Historical Provider, MD  oxyCODONE-acetaminophen (PERCOCET) 10-325 MG per tablet Take 1 tablet by mouth every 8 (eight) hours as needed. For pain    Historical Provider, MD  oxyCODONE-acetaminophen (PERCOCET) 5-325 MG per tablet Take 1-2 tablets by mouth every 6 (six) hours as needed for pain. 06/23/11 07/03/11  Ethelda Chick, MD    Allergies:  No Known Allergies  Social History:  reports that he has never smoked. He does not have any smokeless tobacco history on file. He reports that he drinks about 1.8 ounces of alcohol per week. He reports that he uses illicit drugs (Marijuana). he also has a positive urine drug screen in the past her for cocaine.  Family History: Positive for asthma in his mother.  Physical Exam: Filed Vitals:   06/25/11 1421 06/25/11 1630  BP: 93/55 100/44  Pulse: 92   Temp: 98.3 F (  36.8 C) 97.8 F (36.6 C)  TempSrc: Oral Oral  Resp: 18   Height: 6\' 1"  (1.854 m)   SpO2: 96% 99%   General appearance: alert, cooperative, appears older than stated age and morbidly obese Head: Normocephalic, without obvious abnormality, atraumatic Resp: clear to auscultation bilaterally Chest wall: no tenderness Cardio: regular rate and rhythm, S1, S2 normal, no murmur, click, rub or gallop GI: soft, non-tender; bowel sounds normal; no  masses,  no organomegaly Extremities: 1+ pitting edema bilaterally. Patient's left calf is markedly larger because of swelling compared to the right. He has remnants of a popped blister on the back of his left calf with surrounding erythema involving most of the back side of the calf.   Labs on Admission:   [facility] 06/25/11 1511 06/23/11 0538  NA 133* 139  K 3.0* 3.4*  CL 97 98  CO2 27 27  GLUCOSE 105* 89  BUN 24* 13  CREATININE 1.51* 1.26  CALCIUM 8.5 9.8  MG -- --  PHOS -- --   Urine drug screen is pending.   Basename 06/25/11 1511 06/23/11 0538  WBC 12.6* 7.4  NEUTROABS 11.6* 6.9  HGB 12.2* 14.8  HCT 36.0* 44.5  MCV 89.6 91.0  PLT 119* 153   Radiological Exams on Admission: US Venous Img Lower Unilateral Left  06/23/2011 .  IMPRESSION: No evidence of left lower extremity DVT.  Original Report Authenticated By: Donavan Burnet, M.D.    Assessment/Plan Present on Admission:  .Cellulitis and abscess of leg: He is failed outpatient antibiotic therapy. Will treat with IV Avelox. If his white count worsens or he has a fever, will switch to vancomycin. Based on 1 cultures in 2010 and the fact that he previously has had positive cultures for MSSA, I do not think the vancomycin at this time especially in the setting of mild decrease in renal function.  Acute renal failure: Likely from dehydration, will treat with IV fluids.  .Morbid obesity: Stable.  .Asthma: Stable. When necessary inhaler.  Marland KitchenGERD (gastroesophageal reflux disease): Stable. PPI   Hypokalemia: Replace.  Polysubstance abuse: Will check urine drug screen if and counseled patient if it is positive. Patient also has a reported history of drug-seeking behavior and especially if his younger screen is positive, will try to avoid staying on IV Dilaudid.  .Back pain: Chronic.  Mindee Robledo K 06/25/2011, 5:13 PM

## 2011-06-25 NOTE — ED Provider Notes (Signed)
History     CSN: [id number] Arrival date & time: 06/25/2011  2:27 PM  Chief Complaint  Patient presents with  . Leg Swelling   The history is provided by the patient.  patient states that he has had swelling of his left lower leg since Saturday. He states that he has fever in it and has had cellulitis previously. He was seen in the ED and had a negative doppler. Started on Bactrim. He states that the redness and swelling has gotten worse. Some nausea. No cough or chest pain. No drainage. He states that his doctor told him that when this happens he needs Vancomycin.   Past Medical History  Diagnosis Date  . Back pain   . Asthma     History reviewed. No pertinent past surgical history.  History reviewed. No pertinent family history.  History  Substance Use Topics  . Smoking status: Never Smoker   . Smokeless tobacco: Not on file  . Alcohol Use: 1.8 oz/week    3 Cans of beer per week      Review of Systems  Constitutional: Positive for fever and chills. Negative for activity change and appetite change.  HENT: Negative for neck pain and neck stiffness.   Eyes: Negative for pain.  Respiratory: Negative for chest tightness and shortness of breath.   Cardiovascular: Positive for leg swelling. Negative for chest pain.  Gastrointestinal: Negative for nausea, vomiting, abdominal pain and diarrhea.  Genitourinary: Negative for flank pain.  Musculoskeletal: Negative for back pain.  Skin: Positive for color change. Negative for rash and wound.  Neurological: Negative for weakness, numbness and headaches.  Psychiatric/Behavioral: Negative for behavioral problems.    Physical Exam  BP 93/55  Pulse 92  Temp(Src) 98.3 F (36.8 C) (Oral)  Resp 18  Ht 6\' 1"  (1.854 m)  SpO2 96%  Physical Exam  Nursing note and vitals reviewed. Constitutional: He is oriented to person, place, and time. He appears well-developed and well-nourished.       Morbidly obese.  HENT:  Head:  Normocephalic and atraumatic.  Eyes: EOM are normal. Pupils are equal, round, and reactive to light.  Neck: Normal range of motion. Neck supple.  Cardiovascular: Normal rate, regular rhythm and normal heart sounds.   No murmur heard. Pulmonary/Chest: Effort normal and breath sounds normal.  Abdominal: Soft. Bowel sounds are normal. He exhibits no distension and no mass. There is no tenderness. There is no rebound and no guarding.  Musculoskeletal: Normal range of motion. He exhibits edema.       Left lower leg larger than right. erethema and induration. Pulse intact.   Neurological: He is alert and oriented to person, place, and time. No cranial nerve deficit.  Skin: Skin is warm and dry. There is erythema. No pallor.  Psychiatric: He has a normal mood and affect.    ED Course  Procedures  Results for orders placed during the hospital encounter of 06/25/11  CBC      Component Value Range   WBC 12.6 (*) 4.0 - 10.5 (K/uL)   RBC 4.02 (*) 4.22 - 5.81 (MIL/uL)   Hemoglobin 12.2 (*) 13.0 - 17.0 (g/dL)   HCT 55.7 (*) 32.2 - 52.0 (%)   MCV 89.6  78.0 - 100.0 (fL)   MCH 30.3  26.0 - 34.0 (pg)   MCHC 33.9  30.0 - 36.0 (g/dL)   RDW 02.5  42.7 - 06.2 (%)   Platelets 119 (*) 150 - 400 (K/uL)  DIFFERENTIAL  Component Value Range   Neutrophils Relative 92 (*) 43 - 77 (%)   Lymphocytes Relative 5 (*) 12 - 46 (%)   Monocytes Relative 2 (*) 3 - 12 (%)   Eosinophils Relative 1  0 - 5 (%)   Basophils Relative 0  0 - 1 (%)   Neutro Abs 11.6 (*) 1.7 - 7.7 (K/uL)   Lymphs Abs 0.6 (*) 0.7 - 4.0 (K/uL)   Monocytes Absolute 0.3  0.1 - 1.0 (K/uL)   Eosinophils Absolute 0.1  0.0 - 0.7 (K/uL)   Basophils Absolute 0.0  0.0 - 0.1 (K/uL)   WBC Morphology ATYPICAL MONONUCLEAR CELLS     Smear Review LARGE PLATELETS PRESENT    BASIC METABOLIC PANEL      Component Value Range   Sodium 133 (*) 135 - 145 (mEq/L)   Potassium 3.0 (*) 3.5 - 5.1 (mEq/L)   Chloride 97  96 - 112 (mEq/L)   CO2 27  19 - 32  (mEq/L)   Glucose, Bld 105 (*) 70 - 99 (mg/dL)   BUN 24 (*) 6 - 23 (mg/dL)   Creatinine, Ser 1.61 (*) 0.50 - 1.35 (mg/dL)   Calcium 8.5  8.4 - 09.6 (mg/dL)   GFR calc non Af Amer 50 (*) >60 (mL/min)   GFR calc Af Amer >60  >60 (mL/min)   US Venous Img Lower Unilateral Left  06/23/2011  *RADIOLOGY REPORT*  Clinical Data: Left leg swelling  LEFT LOWER EXTREMITY VENOUS DUPLEX ULTRASOUND  Technique:  Gray-scale sonography with graded compression, as well as color Doppler and duplex ultrasound, were performed to evaluate the deep venous system of the lower extremity from the level of the common femoral vein through the popliteal and proximal calf veins. Spectral Doppler was utilized to evaluate flow at rest and with distal augmentation maneuvers.  Comparison:  11/07/2010  Findings: There is complete compressibility of the left common femoral, femoral, and popliteal veins.  Doppler analysis demonstrates respiratory phasicity and augmentation of flow upon calf compression.  IMPRESSION: No evidence of left lower extremity DVT.  Original Report Authenticated By: Donavan Burnet, M.D.     MDM Swelling of LLE. Previous cellulitis. Negative doppler on Saturday. Worsening despite abx. Will admit. WBC increasing.       Juliet Rude. Rubin Payor, MD 06/25/11 1606

## 2011-06-25 NOTE — ED Notes (Signed)
Pt c/o severe pain in his left lower leg and states that it hurts and burns when you touch it. States that he can't put weight on it today due to pain.

## 2011-06-26 ENCOUNTER — Inpatient Hospital Stay (HOSPITAL_COMMUNITY): Payer: Medicaid Other

## 2011-06-26 ENCOUNTER — Encounter (HOSPITAL_COMMUNITY): Payer: Self-pay | Admitting: Internal Medicine

## 2011-06-26 DIAGNOSIS — D649 Anemia, unspecified: Secondary | ICD-10-CM | POA: Diagnosis present

## 2011-06-26 DIAGNOSIS — D696 Thrombocytopenia, unspecified: Secondary | ICD-10-CM | POA: Diagnosis present

## 2011-06-26 LAB — FERRITIN: Ferritin: 461 ng/mL — ABNORMAL HIGH (ref 22–322)

## 2011-06-26 LAB — MAGNESIUM: Magnesium: 1.9 mg/dL (ref 1.5–2.5)

## 2011-06-26 LAB — CBC
MCH: 30.6 pg (ref 26.0–34.0)
MCHC: 34 g/dL (ref 30.0–36.0)
Platelets: 127 10*3/uL — ABNORMAL LOW (ref 150–400)
RBC: 3.99 MIL/uL — ABNORMAL LOW (ref 4.22–5.81)

## 2011-06-26 LAB — RETICULOCYTES
Retic Count, Absolute: 43.6 10*3/uL (ref 19.0–186.0)
Retic Ct Pct: 1.1 % (ref 0.4–3.1)

## 2011-06-26 LAB — BASIC METABOLIC PANEL
CO2: 26 mEq/L (ref 19–32)
Calcium: 8.6 mg/dL (ref 8.4–10.5)
GFR calc non Af Amer: 56 mL/min — ABNORMAL LOW (ref >60)
Potassium: 3.5 mEq/L (ref 3.5–5.1)
Sodium: 134 mEq/L — ABNORMAL LOW (ref 135–145)

## 2011-06-26 LAB — TSH: TSH: 1.152 u[IU]/mL (ref 0.350–4.500)

## 2011-06-26 LAB — T4, FREE: Free T4: 0.75 ng/dL — ABNORMAL LOW (ref 0.80–1.80)

## 2011-06-26 MED ORDER — PIPERACILLIN-TAZOBACTAM 3.375 G IVPB
3.3750 g | Freq: Three times a day (TID) | INTRAVENOUS | Status: DC
  Administered 2011-06-26 – 2011-06-30 (×10): 3.375 g via INTRAVENOUS
  Filled 2011-06-26 (×15): qty 50

## 2011-06-26 MED ORDER — POTASSIUM CHLORIDE IN NACL 20-0.9 MEQ/L-% IV SOLN
INTRAVENOUS | Status: DC
  Administered 2011-06-26 – 2011-06-29 (×4): via INTRAVENOUS

## 2011-06-26 MED ORDER — SODIUM CHLORIDE 0.9 % IV SOLN
INTRAVENOUS | Status: DC
  Filled 2011-06-26: qty 1000

## 2011-06-26 MED ORDER — ENOXAPARIN SODIUM 40 MG/0.4ML ~~LOC~~ SOLN
40.0000 mg | SUBCUTANEOUS | Status: DC
  Administered 2011-06-26 – 2011-06-30 (×5): 40 mg via SUBCUTANEOUS
  Filled 2011-06-26 (×6): qty 0.4

## 2011-06-26 MED ORDER — VANCOMYCIN HCL IN DEXTROSE 1-5 GM/200ML-% IV SOLN
1000.0000 mg | Freq: Three times a day (TID) | INTRAVENOUS | Status: DC
  Administered 2011-06-26 – 2011-07-01 (×15): 1000 mg via INTRAVENOUS
  Filled 2011-06-26 (×19): qty 200

## 2011-06-26 NOTE — Progress Notes (Signed)
Subjective: The patient states that he has a burning sensation in his left leg. Otherwise no complaints.  Objective: Vital signs in last 24 hours: Filed Vitals:   06/25/11 1630 06/25/11 1700 06/25/11 2240 06/26/11 0519  BP: 100/44  102/63 100/65  Pulse:   92 90  Temp: 97.8 F (36.6 C)  98.9 F (37.2 C) 98.2 F (36.8 C)  TempSrc: Oral  Oral Oral  Resp:   20 20  Height:  6\' 1"  (1.854 m)    Weight:  213.19 kg (470 lb)    SpO2: 99%  95% 94%    Intake/Output Summary (Last 24 hours) at 06/26/11 1422 Last data filed at 06/26/11 0911  Gross per 24 hour  Intake    890 ml  Output      0 ml  Net    890 ml    Weight change:   General appearance: alert, cooperative and morbidly obese Resp: clear to auscultation bilaterally Cardio: regular rate and rhythm, S1, S2 normal, no murmur, click, rub or gallop GI: Massively obese, positive bowel sounds, soft, nondistended. Extremities: Morbidly obese extremities bilaterally. 3+ global edema of the left leg. Right leg has a trace of edema. Pulses: Not palpable secondary to morbid obesity. Skin: Diffuse erythema of the left leg starting from just below the knee to over the dorsum of the left foot. Small blister seen on the calf area, warm to touch. Mild to moderate global tenderness upon palpation.  Lab Results: Basic Metabolic Panel:  Basename 06/26/11 0507 06/25/11 1511  NA 134* 133*  K 3.5 3.0*  CL 99 97  CO2 26 27  GLUCOSE 94 105*  BUN 21 24*  CREATININE 1.37* 1.51*  CALCIUM 8.6 8.5  MG 1.9 --  PHOS -- --   Liver Function Tests: No results found for this basename: AST:2,ALT:2,ALKPHOS:2,BILITOT:2,PROT:2,ALBUMIN:2 in the last 72 hours No results found for this basename: LIPASE:2,AMYLASE:2 in the last 72 hours CBC:  Basename 06/26/11 0507 06/25/11 1511  WBC 13.7* 12.6*  NEUTROABS -- 11.6*  HGB 12.2* 12.2*  HCT 35.9* 36.0*  MCV 90.0 89.6  PLT 127* 119*   Cardiac Enzymes: No results found for this basename:  CKTOTAL:3,CKMB:3,CKMBINDEX:3,TROPONINI:3 in the last 72 hours BNP: No results found for this basename: POCBNP:3 in the last 72 hours D-Dimer: No results found for this basename: DDIMER:2 in the last 72 hours CBG: No results found for this basename: GLUCAP:6 in the last 72 hours Hemoglobin A1C: No results found for this basename: HGBA1C in the last 72 hours Fasting Lipid Panel: No results found for this basename: CHOL,HDL,LDLCALC,TRIG,CHOLHDL,LDLDIRECT in the last 72 hours Thyroid Function Tests: No results found for this basename: TSH,T4TOTAL,FREET4,T3FREE,THYROIDAB in the last 72 hours Anemia Panel:  Basename 06/26/11 0507  VITAMINB12 --  FOLATE --  FERRITIN --  TIBC --  IRON --  RETICCTPCT 1.1   Urine Drug Screen:  Urinalysis:  Misc. Labs:   Micro: No results found for this or any previous visit (from the past 240 hour(s)).  Studies/Results: No results found.  Medications: I have reviewed the patient's current medications.  Assessment:   Recurrent cellulitis of the left leg. He is currently receiving Avelox. We will add vancomycin to cover potential MRSA.   Polysubstance abuse, with drug-seeking behavior.   Anemia and thrombocytopenia.   Acute renal failure. Likely secondary to prerenal azotemia. He is currently receiving IV fluid hydration.   Hyponatremia, likely secondary to hypovolemia and mild dehydration. On current normal saline.  Hypokalemia. His magnesium level is within  normal limits.   Morbid obesity.   Chronic low back pain.   Gastroesophageal reflux disease. He is on Protonix.  Plan: We will add vancomycin for potential MRSA cellulitis. We will continue Avelox. Continue IV fluids, supportive treatment, and when necessary opiates. We'll consult the social worker for polysubstance abuse. We will check an anemia panel and thyroid function tests. We will replete his potassium chloride orally.   LOS: 1 day   Pearce Littlefield 06/26/2011, 2:22  PM

## 2011-06-26 NOTE — Progress Notes (Signed)
06/26/11 1820 Patient c/o chills and leg pain on awakening from nap this afternoon. Given dilaudid 2 mg IV as ordered for severe pain, rechecked temperature 102 degrees F orally, notified Dr Sherrie Mustache. Stated she would place new orders and blood cultures x 2 to be obtained. Nursing staff to monitor.

## 2011-06-26 NOTE — Consult Note (Signed)
ANTIBIOTIC CONSULT NOTE - INITIAL  Pharmacy Consult for Vancomycin Indication: cellulitis  Patient Measurements: Height: 6\' 1"  (185.4 cm) Weight: 470 lb (213.19 kg) IBW/kg (Calculated) : 79.9   Vital Signs: Temp: 98.2 F (36.8 C) (08/22 0519) Temp src: Oral (08/22 0519) BP: 100/65 mmHg (08/22 0519) Pulse Rate: 90  (08/22 0519) Intake/Output from previous day:   Intake/Output from this shift:    Labs:  Basename 06/26/11 0507 06/25/11 1511  WBC 13.7* 12.6*  HGB 12.2* 12.2*  PLT 127* 119*  LABCREA -- --  CREATININE 1.37* 1.51*  CRCLEARANCE -- --   No results found for this basename: VANCOTROUGH:2,VANCOPEAK:2,VANCORANDOM:2,GENTTROUGH:2,GENTPEAK:2,GENTRANDOM:2,TOBRATROUGH:2,TOBRAPEAK:2,TOBRARND:2,AMIKACINPEAK:2,AMIKACINTROU:2,AMIKACIN:2, in the last 72 hours   Microbiology: No results found for this or any previous visit (from the past 720 hour(s)).  Medical History: Past Medical History  Diagnosis Date  . Back pain   . Asthma   . Cellulitis of left leg   . Kidney stones   . Obesity   . Homeless     Medications:  Scheduled:    . sodium chloride   Intravenous STAT  . diazepam  10 mg Oral TID  . enoxaparin  40 mg Subcutaneous Q24H  . loratadine  10 mg Oral Daily  .  morphine injection  4 mg Intravenous Once  . moxifloxacin  400 mg Intravenous Q24H  . pantoprazole  40 mg Oral Q1200  . potassium chloride  40 mEq Oral Once  . vancomycin  1,000 mg Intravenous Once  . vancomycin  1,000 mg Intravenous Q8H   Assessment: Morbid obesity, good renal fxn  Goal of Therapy:  Vancomycin trough level 10-15 mcg/ml  Plan:  Measure antibiotic drug levels at steady state Vancomycin 1gm iv q8hrs for now Monitor renal fxn  Margo Aye, Allon Costlow A 06/26/2011,7:51 AM

## 2011-06-27 DIAGNOSIS — R946 Abnormal results of thyroid function studies: Secondary | ICD-10-CM | POA: Diagnosis present

## 2011-06-27 DIAGNOSIS — D509 Iron deficiency anemia, unspecified: Secondary | ICD-10-CM | POA: Diagnosis present

## 2011-06-27 DIAGNOSIS — E538 Deficiency of other specified B group vitamins: Secondary | ICD-10-CM | POA: Diagnosis present

## 2011-06-27 LAB — COMPREHENSIVE METABOLIC PANEL
AST: 16 U/L (ref 0–37)
Albumin: 2.1 g/dL — ABNORMAL LOW (ref 3.5–5.2)
Alkaline Phosphatase: 152 U/L — ABNORMAL HIGH (ref 39–117)
Chloride: 98 mEq/L (ref 96–112)
Potassium: 3.2 mEq/L — ABNORMAL LOW (ref 3.5–5.1)
Sodium: 133 mEq/L — ABNORMAL LOW (ref 135–145)
Total Bilirubin: 0.4 mg/dL (ref 0.3–1.2)

## 2011-06-27 LAB — CBC
MCH: 29.9 pg (ref 26.0–34.0)
MCV: 90.7 fL (ref 78.0–100.0)
Platelets: 122 10*3/uL — ABNORMAL LOW (ref 150–400)
RBC: 3.64 MIL/uL — ABNORMAL LOW (ref 4.22–5.81)
RDW: 14.5 % (ref 11.5–15.5)

## 2011-06-27 LAB — T3, FREE: T3, Free: 1.8 pg/mL — ABNORMAL LOW (ref 2.3–4.2)

## 2011-06-27 MED ORDER — IBUPROFEN 600 MG PO TABS
600.0000 mg | ORAL_TABLET | Freq: Three times a day (TID) | ORAL | Status: AC
  Administered 2011-06-27 – 2011-06-29 (×9): 600 mg via ORAL
  Filled 2011-06-27 (×9): qty 1

## 2011-06-27 MED ORDER — THERA M PLUS PO TABS
1.0000 | ORAL_TABLET | Freq: Every day | ORAL | Status: DC
Start: 2011-06-27 — End: 2011-07-01
  Administered 2011-06-27 – 2011-07-01 (×5): 1 via ORAL
  Filled 2011-06-27 (×5): qty 1

## 2011-06-27 MED ORDER — FOLIC ACID 1 MG PO TABS
1.0000 mg | ORAL_TABLET | Freq: Every day | ORAL | Status: DC
  Administered 2011-06-27 – 2011-07-01 (×5): 1 mg via ORAL
  Filled 2011-06-27 (×5): qty 1

## 2011-06-27 MED ORDER — SODIUM CHLORIDE 0.9 % IJ SOLN
INTRAMUSCULAR | Status: AC
  Administered 2011-06-27: 23:00:00
  Filled 2011-06-27: qty 10

## 2011-06-27 MED ORDER — SODIUM CHLORIDE 0.9 % IJ SOLN
INTRAMUSCULAR | Status: AC
  Administered 2011-06-27: 20:00:00
  Filled 2011-06-27: qty 3
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
## 2011-06-27 NOTE — Progress Notes (Signed)
UR Chart Review Completed  
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
## 2011-06-27 NOTE — Consult Note (Signed)
ANTIBIOTIC CONSULT NOTE  Pharmacy Consult for Vancomycin/Zosyn Indication: cellulitis  Patient Measurements: Height: 6\' 1"  (185.4 cm) Weight: 470 lb (213.19 kg) IBW/kg (Calculated) : 79.9   Vital Signs: Temp: 97.9 F (36.6 C) (08/23 0601) BP: 119/70 mmHg (08/23 0601) Pulse Rate: 91  (08/23 0601) Intake/Output from previous day: 08/22 0701 - 08/23 0700 In: 2271.3 [P.O.:960; I.V.:1111.3; IV Piggyback:200] Out: 300 [Urine:300]  Labs:  [facility] 06/27/11 0520 06/26/11 0507 06/25/11 1511  WBC 10.7* 13.7* 12.6*  HGB 10.9* 12.2* 12.2*  PLT 122* 127* 119*  LABCREA -- -- --  CREATININE 1.15 1.37* 1.51*  CRCLEARANCE -- -- --    Alvira Philips 06/27/11 0708  VANCOTROUGH 14.2  VANCOPEAK --  VANCORANDOM --  GENTTROUGH --  GENTPEAK --  GENTRANDOM --  TOBRATROUGH --  TOBRAPEAK --  TOBRARND --  AMIKACINPEAK --  AMIKACINTROU --  AMIKACIN --     Microbiology: No results found for this or any previous visit (from the past 720 hour(s)).  Medical History: Past Medical History  Diagnosis Date  . Back pain   . Asthma   . Cellulitis of left leg   . Kidney stones   . Obesity   . Homeless   . Polysubstance abuse     Medications:  Scheduled:     . diazepam  10 mg Oral TID  . enoxaparin  40 mg Subcutaneous Q24H  . folic acid  1 mg Oral Daily  . ibuprofen  600 mg Oral TID  . loratadine  10 mg Oral Daily  . multivitamins ther. w/minerals  1 tablet Oral Daily  . pantoprazole  40 mg Oral Q1200  . piperacillin-tazobactam (ZOSYN)  IV  3.375 g Intravenous Q8H  . vancomycin  1,000 mg Intravenous Q8H  . DISCONTD: enoxaparin  40 mg Subcutaneous Q24H  . DISCONTD: moxifloxacin  400 mg Intravenous Q24H   Assessment: Morbid obesity, good renal fxn Trough @ goal.  Goal of Therapy:  Vancomycin trough level 10-15 mcg/ml  Plan:  Measure antibiotic drug levels at steady state Continue Vancomycin 1gm iv q8hrs for now Monitor renal fxn Zosyn 3.375gm IV every 8 hours.  Lamonte Richer  R 06/27/2011,10:58 AM

## 2011-06-28 LAB — CBC
MCHC: 33.5 g/dL (ref 30.0–36.0)
MCV: 90 fL (ref 78.0–100.0)
Platelets: 145 10*3/uL — ABNORMAL LOW (ref 150–400)
RDW: 14.6 % (ref 11.5–15.5)
WBC: 9.9 10*3/uL (ref 4.0–10.5)

## 2011-06-28 LAB — BASIC METABOLIC PANEL
Chloride: 103 mEq/L (ref 96–112)
Creatinine, Ser: 1.08 mg/dL (ref 0.50–1.35)
GFR calc Af Amer: >60 mL/min (ref >60)
GFR calc non Af Amer: >60 mL/min (ref >60)
Potassium: 3.4 mEq/L — ABNORMAL LOW (ref 3.5–5.1)

## 2011-06-28 MED ORDER — SODIUM CHLORIDE 0.9 % IJ SOLN
INTRAMUSCULAR | Status: AC
  Administered 2011-06-28: 10 mL
  Filled 2011-06-28: qty 10

## 2011-06-28 MED ORDER — SODIUM CHLORIDE 0.9 % IJ SOLN
INTRAMUSCULAR | Status: AC
  Administered 2011-06-28: 14:00:00
  Filled 2011-06-28: qty 10
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
## 2011-06-29 LAB — URINALYSIS, ROUTINE W REFLEX MICROSCOPIC
Bilirubin Urine: NEGATIVE
Leukocytes, UA: NEGATIVE
Nitrite: NEGATIVE
Specific Gravity, Urine: 1.02 (ref 1.005–1.030)
Urobilinogen, UA: 0.2 mg/dL (ref 0.0–1.0)
pH: 5.5 (ref 5.0–8.0)

## 2011-06-29 LAB — CBC
HCT: 30.6 % — ABNORMAL LOW (ref 39.0–52.0)
RDW: 14.7 % (ref 11.5–15.5)
WBC: 10.7 10*3/uL — ABNORMAL HIGH (ref 4.0–10.5)

## 2011-06-29 LAB — BASIC METABOLIC PANEL
BUN: 15 mg/dL (ref 6–23)
Chloride: 104 mEq/L (ref 96–112)
GFR calc Af Amer: >60 mL/min (ref >60)
Potassium: 3.3 mEq/L — ABNORMAL LOW (ref 3.5–5.1)
Sodium: 138 mEq/L (ref 135–145)

## 2011-06-29 MED ORDER — POTASSIUM CHLORIDE CRYS ER 20 MEQ PO TBCR
40.0000 meq | EXTENDED_RELEASE_TABLET | Freq: Two times a day (BID) | ORAL | Status: AC
  Administered 2011-06-29 – 2011-06-30 (×4): 40 meq via ORAL
  Filled 2011-06-29 (×4): qty 2
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
## 2011-06-30 LAB — URINE CULTURE: Culture  Setup Time: 201208252016

## 2011-06-30 MED ORDER — AMOXICILLIN-POT CLAVULANATE 875-125 MG PO TABS
1.0000 | ORAL_TABLET | Freq: Two times a day (BID) | ORAL | Status: DC
  Administered 2011-06-30 – 2011-07-01 (×3): 1 via ORAL
  Filled 2011-06-30 (×3): qty 1

## 2011-06-30 MED ORDER — SODIUM CHLORIDE 0.9 % IJ SOLN
INTRAMUSCULAR | Status: AC
  Filled 2011-06-30: qty 3

## 2011-06-30 MED ORDER — OXYCODONE HCL 5 MG PO TABS
10.0000 mg | ORAL_TABLET | ORAL | Status: DC | PRN
  Administered 2011-06-30 – 2011-07-01 (×5): 10 mg via ORAL
  Filled 2011-06-30 (×5): qty 2

## 2011-06-30 MED ORDER — SODIUM CHLORIDE 0.9 % IJ SOLN
INTRAMUSCULAR | Status: AC
  Administered 2011-06-30: 15:00:00
  Filled 2011-06-30: qty 10

## 2011-06-30 NOTE — Consult Note (Signed)
ANTIBIOTIC CONSULT NOTE  Pharmacy Consult for Vancomycin/Zosyn Indication: Cellulitis  Patient Measurements: Height: 6\' 1"  (185.4 cm) Weight: 470 lb (213.19 kg) IBW/kg (Calculated) : 79.9   Vital Signs: Temp: 98.5 F (36.9 C) (08/26 0633) Temp src: Oral (08/26 0633) BP: 105/67 mmHg (08/26 1610) Pulse Rate: 80  (08/26 0633) Intake/Output from previous day: 08/25 0701 - 08/26 0700 In: 2511.7 [P.O.:1440; I.V.:621.7; IV Piggyback:450] Out: 300 [Urine:300]  Labs:  [facility] 06/29/11 0620 06/28/11 0446  WBC 10.7* 9.9  HGB 10.2* 11.2*  PLT 187 145*  LABCREA -- --  CREATININE 0.90 1.08  CRCLEARANCE -- --      Microbiology: Recent Results (from the past 720 hour(s))  CULTURE, BLOOD (ROUTINE X 2)     Status: Normal (Preliminary result)   Collection Time   06/26/11  6:30 PM      Component Value Range Status Comment   Specimen Description RIGHT ANTECUBITAL   Final    Special Requests BOTTLES DRAWN AEROBIC AND ANAEROBIC 8CC EACH   Final    Culture NO GROWTH 4 DAYS   Final    Report Status PENDING   Incomplete   CULTURE, BLOOD (ROUTINE X 2)     Status: Normal (Preliminary result)   Collection Time   06/26/11  6:30 PM      Component Value Range Status Comment   Specimen Description BLOOD LEFT HAND   Final    Special Requests BOTTLES DRAWN AEROBIC AND ANAEROBIC 6CC EACH   Final    Culture NO GROWTH 4 DAYS   Final    Report Status PENDING   Incomplete     Medical History: Past Medical History  Diagnosis Date  . Back pain   . Asthma   . Cellulitis of left leg   . Kidney stones   . Obesity   . Homeless   . Polysubstance abuse     Medications:     Reviewed.   Assessment: Morbid obesity. Renal function parameters are stable. Trough @ goa on 8/23/12l.  Goal of Therapy:  Vancomycin trough level 10-15 mcg/ml  Plan:  Measure antibiotic drug levels at steady state (due again on 07/03/11) Continue Vancomycin 1gm iv q8hrs for now Monitor renal fxn Zosyn 3.375gm IV  every 8 hours.  Gilman Buttner, [place] J 06/30/2011,9:07 AM
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
## 2011-07-01 LAB — CULTURE, BLOOD (ROUTINE X 2): Culture: NO GROWTH

## 2011-07-01 MED ORDER — ACETAMINOPHEN 325 MG PO TABS: 650.0000 mg | ORAL_TABLET | Freq: Four times a day (QID) | ORAL | Status: AC | PRN

## 2011-07-01 MED ORDER — DOXYCYCLINE HYCLATE 100 MG PO TABS: 100.0000 mg | ORAL_TABLET | Freq: Two times a day (BID) | ORAL | Status: AC

## 2011-07-01 MED ORDER — AMOXICILLIN-POT CLAVULANATE 875-125 MG PO TABS: 1.0000 | ORAL_TABLET | Freq: Two times a day (BID) | ORAL | Status: AC

## 2011-07-01 MED ORDER — OXYCODONE HCL 5 MG PO TABS: 5.0000 mg | ORAL_TABLET | ORAL | Status: DC | PRN

## 2011-07-01 NOTE — Discharge Summary (Signed)
Physician Discharge Summary  Patient ID: Albert Klein MRN: [id number] DOB/AGE: August 04, 1966 45 y.o.  Admit date: 06/25/2011 Discharge date: 07/01/2011  Discharge Diagnoses:  Principal Problem:  *Cellulitis and abscess of leg Active Problems:  ARF (acute renal failure)  Morbid obesity  Asthma  GERD (gastroesophageal reflux disease)  Back pain  Polysubstance abuse  Hypokalemia  Thrombocytopenia  Anemia  Folic acid deficiency  Iron deficiency anemia  Abnormal thyroid function test   Current Discharge Medication List    START taking these medications   Details  acetaminophen (TYLENOL) 325 MG tablet Take 2 tablets (650 mg total) by mouth every 6 (six) hours as needed (or Fever >/= 101). Qty: 30 tablet    amoxicillin-clavulanate (AUGMENTIN) 875-125 MG per tablet Take 1 tablet by mouth every 12 (twelve) hours. Qty: 10 tablet, Refills: 0    doxycycline (VIBRA-TABS) 100 MG tablet Take 1 tablet (100 mg total) by mouth 2 (two) times daily. Qty: 10 tablet, Refills: 0    oxyCODONE (OXY IR/ROXICODONE) 5 MG immediate release tablet Take 1 tablet (5 mg total) by mouth every 4 (four) hours as needed for pain. Qty: 20 tablet, Refills: 0      CONTINUE these medications which have NOT CHANGED   Details  albuterol (PROVENTIL,VENTOLIN) 90 MCG/ACT inhaler Inhale 2 puffs into the lungs every 6 (six) hours as needed. Wheezing     cetirizine (ZYRTEC) 10 MG tablet Take 10 mg by mouth daily.      diazepam (VALIUM) 10 MG tablet Take 10 mg by mouth 3 (three) times daily. For anxiety    ibuprofen (ADVIL,MOTRIN) 200 MG tablet Take 400 mg by mouth every 6 (six) hours as needed. Pain     omeprazole (PRILOSEC) 20 MG capsule Take 20 mg by mouth daily.        STOP taking these medications     oxyCODONE-acetaminophen (PERCOCET) 10-650 MG per tablet      sulfamethoxazole-trimethoprim (SEPTRA DS) 800-160 MG per tablet      oxyCODONE-acetaminophen (PERCOCET) 10-325 MG per tablet      oxyCODONE-acetaminophen (PERCOCET) 5-325 MG per tablet         Discharge Orders    Future Orders Please Complete By Expires   Increase activity slowly      Discharge instructions      Comments:   Keep left leg elevated. Keep feet clean and dry. Limit caloric intake to 2000 calories a day or less      Follow-up Information    Make an appointment with [facility]. (for outpatient followup )    Contact information:   [phone]      Follow up with Primary care provider. (If symptoms worsen)          Disposition: Home or Self Care  Discharged Condition: Stable  Consults:   social work  Labs:  No results found for this or any previous visit (from the past 48 hour(s)).  Diagnostics:  US Venous Img Lower Unilateral Left  06/23/2011  *RADIOLOGY REPORT*  Clinical Data: Left leg swelling  LEFT LOWER EXTREMITY VENOUS DUPLEX ULTRASOUND  Technique:  Gray-scale sonography with graded compression, as well as color Doppler and duplex ultrasound, were performed to evaluate the deep venous system of the lower extremity from the level of the common femoral vein through the popliteal and proximal calf veins. Spectral Doppler was utilized to evaluate flow at rest and with distal augmentation maneuvers.  Comparison:  11/07/2010  Findings: There is complete compressibility of the left common  femoral, femoral, and popliteal veins.  Doppler analysis demonstrates respiratory phasicity and augmentation of flow upon calf compression.  IMPRESSION: No evidence of left lower extremity DVT.  Original Report Authenticated By: Donavan Burnet, M.D.   Dg Chest Portable 1 View  06/26/2011  *RADIOLOGY REPORT*  Clinical Data: Fever.  Cellulitis.  PORTABLE CHEST - 1 VIEW  Comparison: Chest 11/06/2010.  Findings: Lungs are clear. Heart size is upper normal.  No pneumothorax or effusion.  IMPRESSION: No acute disease.  Original Report Authenticated By: Bernadene Bell. Maricela Curet, M.D.   Full Code   Hospital  Course: Please see H&P for complete admission details. The patient is a 45 year old homeless morbidly obese white male with recurrent leg cellulitis. He presents with left leg cellulitis. He had been seen several days prior to admission in the emergency room. He had been given Bactrim and sent home. He returned because of worsening pain and redness. He had evidence of swelling, erythema, warmth, blistering/bullae. His white blood count was elevated. He was started on Avelox initially. He spiked a fever and was subsequently switched to vancomycin and Zosyn. His progress was slow but by the time of discharge the blistering was gone and the cellulitis was much improved. He can continue 5 more days doxycycline and Augmentin pain. Social work was consulted for his polysubstance abuse. He had a urine drug screen positive for cocaine, THC, and opiates. He did have drug seeking behavior while here. Social worker also assisted with a ride to his cousin's house where he will stay until his leg improves. We kept his leg elevated. I've discussed weight loss strategies. He was noted to be calling to the cafeteria continuously with requests for extra meals. We talked about the importance of good hygiene. Apparently, sometimes he lives in a tent. He panhandles for extra cash. Total time on the day of discharge was greater than 30 minutes.  Discharge Exam: Blood pressure 132/73, pulse 74, temperature 97.5 F (36.4 C), temperature source Oral, resp. rate 20, height 6\' 1"  (1.854 m), weight 213.19 kg (470 lb), SpO2 93.00%. Leg continues to improve.   SignedChristiane Ha 07/01/2011, 11:34 AM

## 2011-07-01 NOTE — Consult Note (Signed)
Pt unable to find ride and has no money for transportation.  CSW arranged transport to pt's home via [employer].  No other CSW needs reported.   Karn Cassis

## 2011-07-01 NOTE — Progress Notes (Signed)
Pt discharged home today per Dr. Lendell Caprice by Eulis Canner, RN. IV site D/C'd and WNL. VS stable at this time. Pt provided with home medication list, discharge instructions and prescriptions. Pt verbalized understanding. Pt left floor via WC accompanied by Ishmael Holter, NT in stable condition. Dagoberto Ligas, RN

## 2011-07-01 NOTE — Progress Notes (Signed)
Discharge instructions giving to pt and pharmacy scripts.  Pt verbalized understanding of all instructions.  IV removed from Rt wrist with cath tip in place.  Cab to pick pt up to take him to his home at 1pm. Ernestine Mcmurray, [place]

## 2011-07-15 NOTE — Progress Notes (Signed)
Encounter addended by: Clarene Critchley on: 07/15/2011 10:46 AM<BR>     Documentation filed: Flowsheet VN

## 2011-07-30 LAB — DIFFERENTIAL
Basophils Absolute: 0
Basophils Relative: 0
Basophils Relative: 0
Basophils Relative: 0
Eosinophils Absolute: 0
Eosinophils Absolute: 0
Eosinophils Relative: 0
Lymphocytes Relative: 23
Lymphocytes Relative: 7 — ABNORMAL LOW
Lymphs Abs: 0.6 — ABNORMAL LOW
Lymphs Abs: 0.8
Lymphs Abs: 1.2
Monocytes Absolute: 0.8
Monocytes Relative: 6
Monocytes Relative: 7
Neutro Abs: 2.3
Neutro Abs: 6.5
Neutro Abs: 9.2 — ABNORMAL HIGH
Neutrophils Relative %: 66
Neutrophils Relative %: 75
Neutrophils Relative %: 86 — ABNORMAL HIGH

## 2011-07-30 LAB — BLOOD GAS, ARTERIAL
FIO2: 21
O2 Saturation: 95.2
pH, Arterial: 7.46 — ABNORMAL HIGH

## 2011-07-30 LAB — CBC
HCT: 36.8 — ABNORMAL LOW
Hemoglobin: 12.3 — ABNORMAL LOW
Hemoglobin: 12.8 — ABNORMAL LOW
Hemoglobin: 13.8
MCHC: 34.1
MCHC: 34.8
MCHC: 34.9
MCV: 88.8
MCV: 89.3
MCV: 89.3
Platelets: 174
RBC: 3.95 — ABNORMAL LOW
RBC: 4.49
RDW: 14.2
WBC: 3.5 — ABNORMAL LOW
WBC: 7

## 2011-07-30 LAB — EXPECTORATED SPUTUM ASSESSMENT W GRAM STAIN, RFLX TO RESP C

## 2011-07-30 LAB — COMPREHENSIVE METABOLIC PANEL
ALT: 19
BUN: 19
CO2: 26
Calcium: 8.6
Calcium: 8.8
Chloride: 104
Creatinine, Ser: 1.04
GFR calc non Af Amer: >60
Glucose, Bld: 125 — ABNORMAL HIGH
Glucose, Bld: 131 — ABNORMAL HIGH
Sodium: 136
Total Bilirubin: 0.3
Total Protein: 6.3

## 2011-07-30 LAB — BASIC METABOLIC PANEL
BUN: 7
CO2: 29
Calcium: 9.1
Calcium: 9.4
Creatinine, Ser: 1.17
GFR calc Af Amer: >60
GFR calc non Af Amer: >60
Sodium: 136

## 2011-07-30 LAB — CULTURE, RESPIRATORY W GRAM STAIN: Culture: NORMAL

## 2011-08-05 LAB — CBC
HCT: 35.1 — ABNORMAL LOW
HCT: 35.3 — ABNORMAL LOW
Hemoglobin: 12.2 — ABNORMAL LOW
Hemoglobin: 13
MCHC: 34.2
MCV: 90.4
MCV: 91.3
Platelets: 150
Platelets: 181
Platelets: 189
RBC: 4.2 — ABNORMAL LOW
RDW: 14.4
RDW: 14.9
WBC: 5.8
WBC: 6.8
WBC: 7.5

## 2011-08-05 LAB — DIFFERENTIAL
Basophils Absolute: 0
Basophils Absolute: 0
Basophils Relative: 0
Eosinophils Absolute: 0.1
Eosinophils Relative: 0
Eosinophils Relative: 1
Eosinophils Relative: 1
Lymphocytes Relative: 12
Lymphocytes Relative: 14
Lymphocytes Relative: 20
Lymphs Abs: 0.7
Lymphs Abs: 1.1
Lymphs Abs: 1.2
Neutro Abs: 3.6
Neutrophils Relative %: 73
Neutrophils Relative %: 73
Neutrophils Relative %: 78 — ABNORMAL HIGH

## 2011-08-05 LAB — COMPREHENSIVE METABOLIC PANEL
BUN: 8
CO2: 28
Calcium: 8.9
Creatinine, Ser: 1.21
GFR calc Af Amer: >60
GFR calc non Af Amer: >60
Glucose, Bld: 102 — ABNORMAL HIGH

## 2011-08-05 LAB — BASIC METABOLIC PANEL
BUN: 10
BUN: 12
BUN: 9
Calcium: 8.6
Calcium: 8.9
Chloride: 105
Creatinine, Ser: 1.22
Creatinine, Ser: 1.27
GFR calc non Af Amer: >60
GFR calc non Af Amer: >60
Glucose, Bld: 111 — ABNORMAL HIGH
Glucose, Bld: 96
Potassium: 3.6
Potassium: 3.6
Sodium: 132 — ABNORMAL LOW

## 2011-08-05 LAB — CULTURE, BLOOD (ROUTINE X 2)
Culture: NO GROWTH
Report Status: 10112009

## 2011-08-05 LAB — URIC ACID
Uric Acid, Serum: 6.8
Uric Acid, Serum: 7.4

## 2011-08-05 LAB — SEDIMENTATION RATE: Sed Rate: 26 — ABNORMAL HIGH

## 2011-08-05 LAB — HEPARIN ANTI-XA: Heparin LMW: 0.59

## 2011-08-08 LAB — CULTURE, ROUTINE-ABSCESS

## 2011-08-15 LAB — BASIC METABOLIC PANEL
BUN: 8
CO2: 27
Chloride: 107
Creatinine, Ser: 1
Glucose, Bld: 95
Potassium: 3.8

## 2011-08-15 LAB — URINALYSIS, ROUTINE W REFLEX MICROSCOPIC
Glucose, UA: NEGATIVE
Hgb urine dipstick: NEGATIVE
Ketones, ur: NEGATIVE
Protein, ur: NEGATIVE
pH: 6

## 2011-11-16 IMAGING — US US EXTREM LOW VENOUS*L*
1 series · 14 of 24 positions shown · non-contrast
Comparison: None.

CLINICAL DATA: Left lower extremity pain and swelling.

LEFT LOWER EXTREMITY VENOUS DUPLEX ULTRASOUND
TECHNIQUE: Gray-scale sonography with graded compression, as well
as color Doppler and duplex ultrasound were performed to evaluate
the deep venous system of the lower extremity from the level of the
common femoral vein through the popliteal and proximal calf veins.
Spectral Doppler was utilized to evaluate flow at rest and with
distal augmentation maneuvers.

[Series 1: us extrem low venous*left* · 14 of 31 slices shown]
[im 1/31]
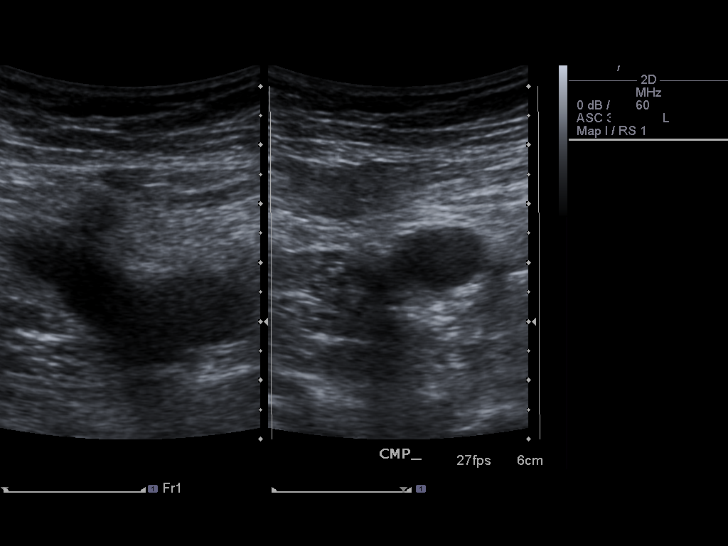
[im 3/31]
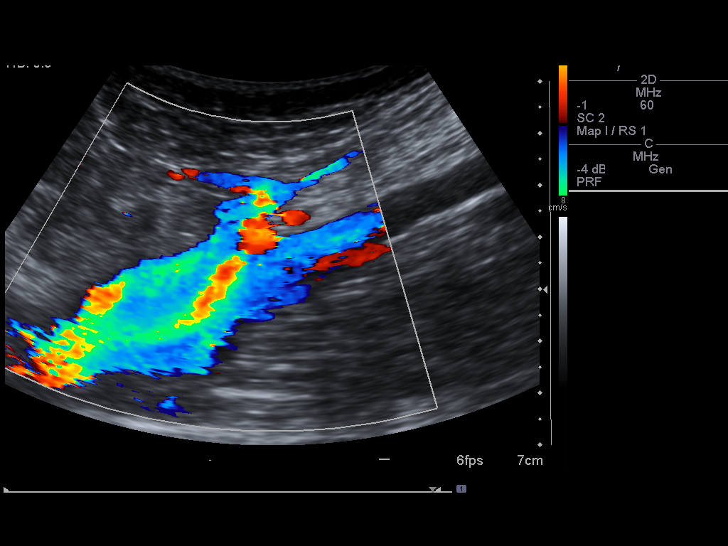
[im 6/31]
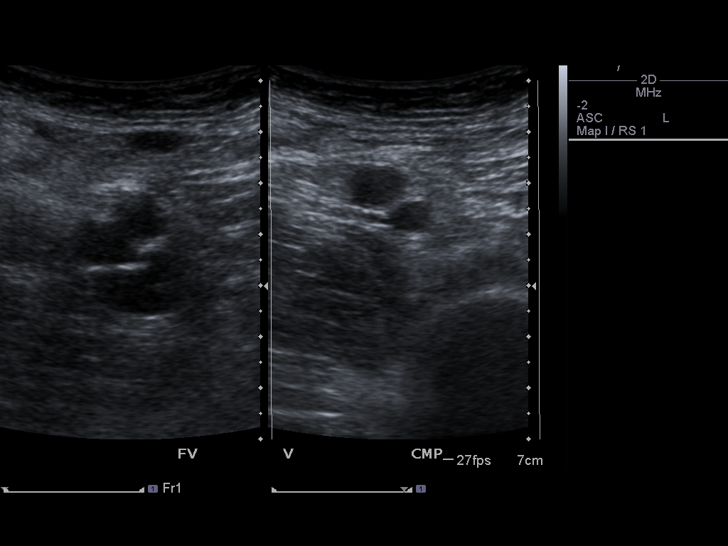
[im 8/31]
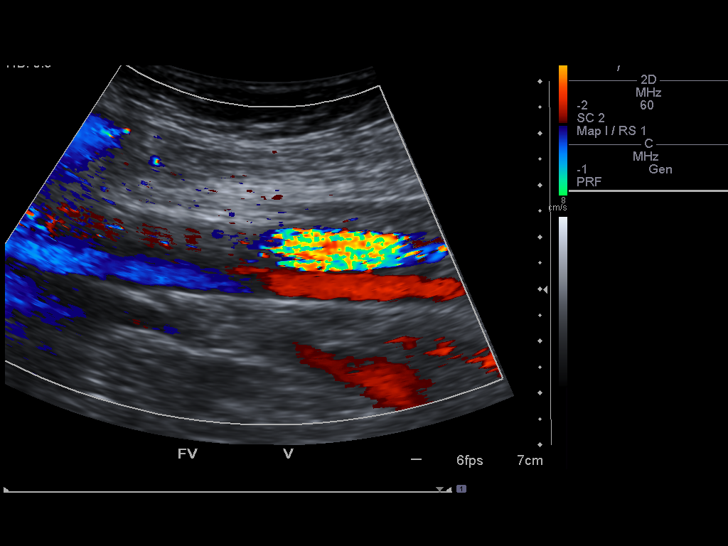
[im 10/31]
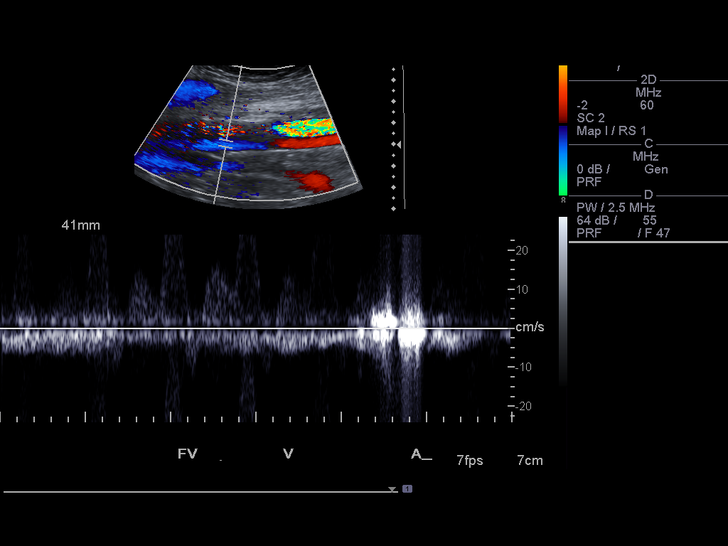
[im 12/31]
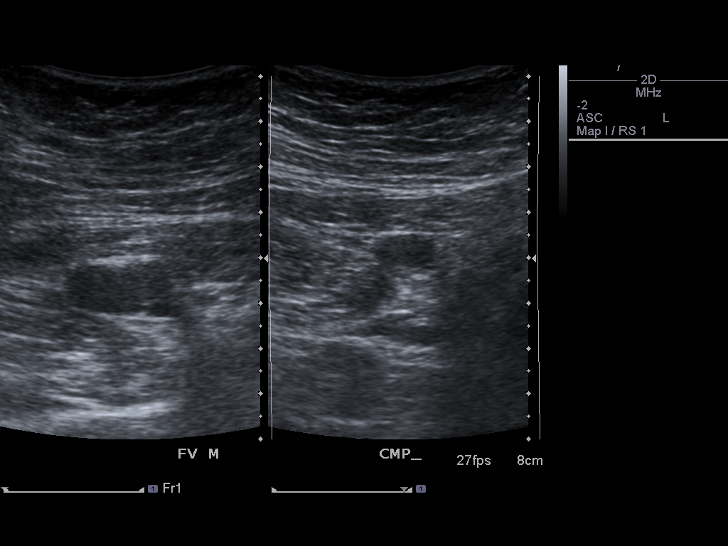
[im 15/31]
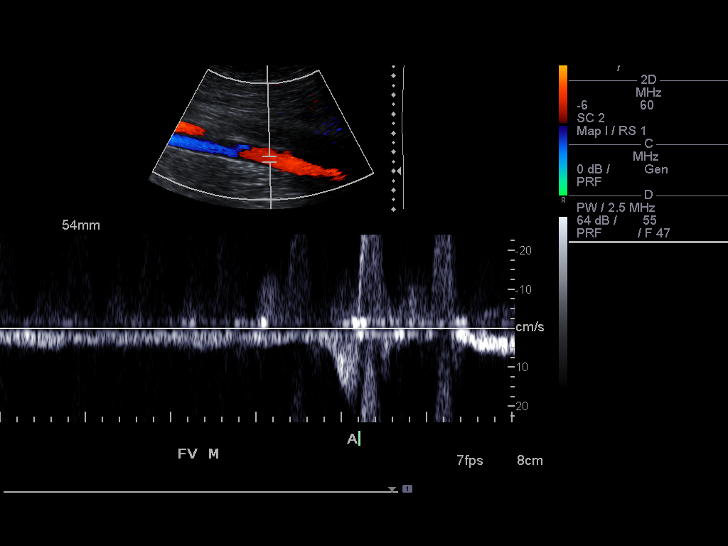
[im 16/31]
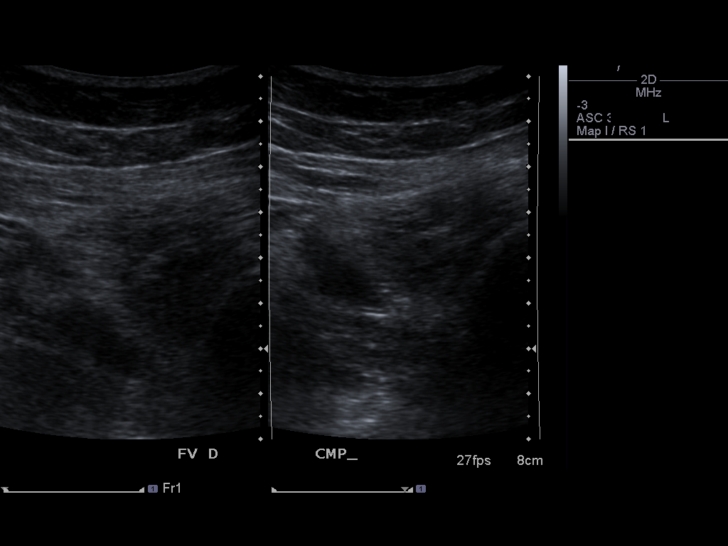
[im 19/31]
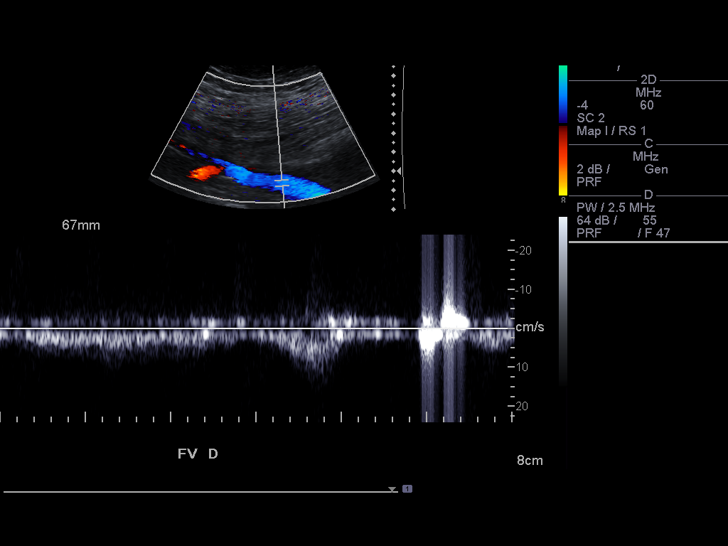
[im 21/31]
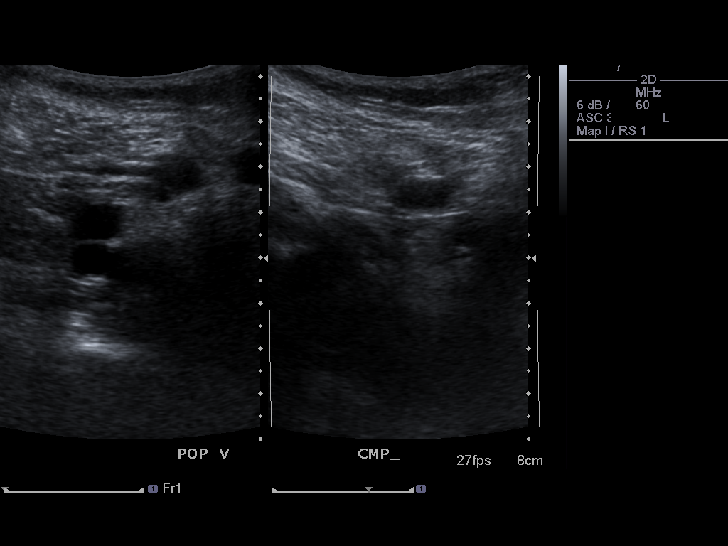
[im 24/31]
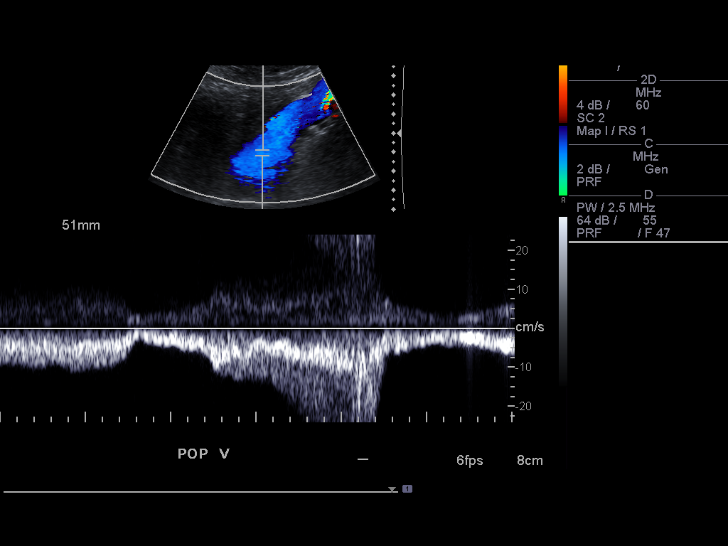
[im 25/31]
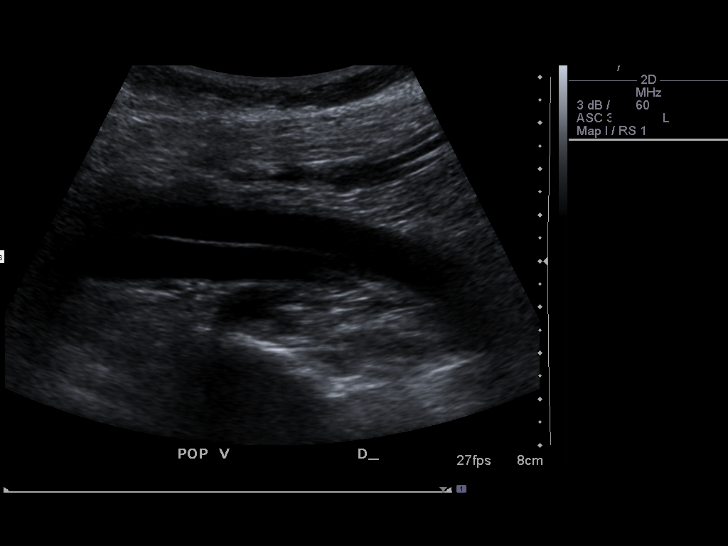
[im 28/31]
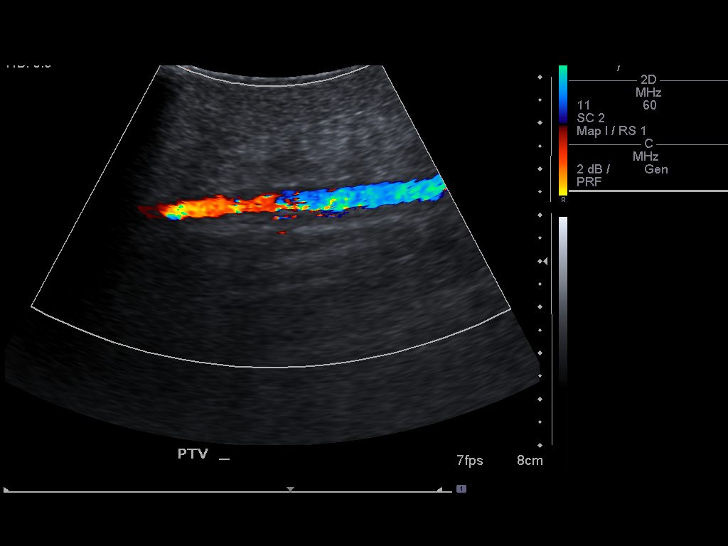
[im 31/31]
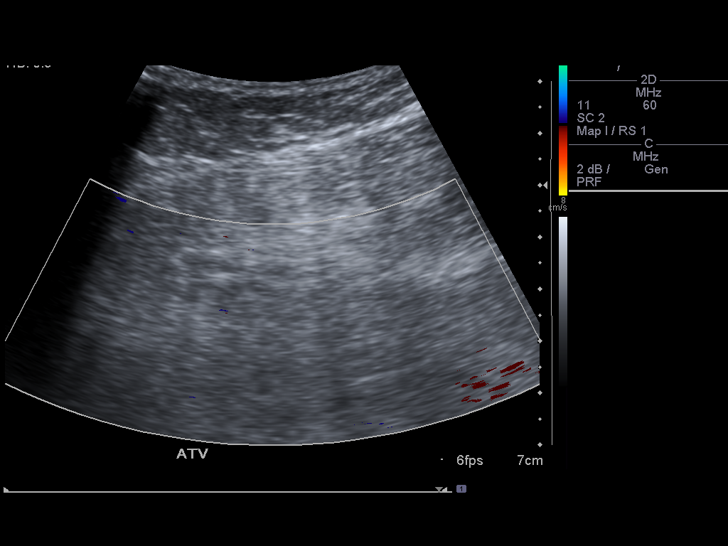

[14 of 24 positions shown; findings below may reference images not displayed]

FINDINGS: Normal compressibility of the common femoral,
superficial femoral, and popliteal veins is demonstrated, as well
as the visualized proximal calf veins.  No filling defects to
suggest DVT on grayscale or color Doppler imaging.  Doppler
waveforms show normal direction of venous flow, normal respiratory
phasicity and response to augmentation.
IMPRESSION: No evidence of left lower extremity deep vein thrombosis.

## 2012-02-09 ENCOUNTER — Emergency Department (HOSPITAL_COMMUNITY): Payer: Medicaid Other

## 2012-02-09 ENCOUNTER — Encounter (HOSPITAL_COMMUNITY): Payer: Self-pay

## 2012-02-09 ENCOUNTER — Emergency Department (HOSPITAL_COMMUNITY)
Admission: EM | Admit: 2012-02-09 | Discharge: 2012-02-09 | Disposition: A | Discharge status: Home / Self Care | Payer: Medicaid Other | Attending: Emergency Medicine | Admitting: Emergency Medicine

## 2012-02-09 DIAGNOSIS — J45909 Unspecified asthma, uncomplicated: Secondary | ICD-10-CM | POA: Insufficient documentation

## 2012-02-09 DIAGNOSIS — M25512 Pain in left shoulder: Secondary | ICD-10-CM

## 2012-02-09 DIAGNOSIS — Z6841 Body Mass Index (BMI) 40.0 and over, adult: Secondary | ICD-10-CM | POA: Insufficient documentation

## 2012-02-09 DIAGNOSIS — M25519 Pain in unspecified shoulder: Secondary | ICD-10-CM | POA: Insufficient documentation

## 2012-02-09 MED ORDER — HYDROMORPHONE HCL PF 1 MG/ML IJ SOLN
2.0000 mg | Freq: Once | INTRAMUSCULAR | Status: AC
  Administered 2012-02-09: 2 mg via INTRAMUSCULAR
  Filled 2012-02-09: qty 2

## 2012-02-09 MED ORDER — OXYCODONE-ACETAMINOPHEN 5-325 MG PO TABS: 1.0000 | ORAL_TABLET | ORAL | Status: AC | PRN

## 2012-02-09 MED ORDER — OXYCODONE-ACETAMINOPHEN 5-325 MG PO TABS
1.0000 | ORAL_TABLET | Freq: Once | ORAL | Status: AC
  Administered 2012-02-09: 1 via ORAL
  Filled 2012-02-09: qty 1

## 2012-02-09 NOTE — Discharge Instructions (Signed)
Shoulder Pain The shoulder is a ball and socket joint. The muscles and tendons (rotator cuff) are what keep the shoulder in its joint and stable. This collection of muscles and tendons holds in the head (ball) of the humerus (upper arm bone) in the fossa (cup) of the scapula (shoulder blade). Today no reason was found for your shoulder pain. Often pain in the shoulder may be treated conservatively with temporary immobilization. For example, holding the shoulder in one place using a sling for rest. Physical therapy may be needed if problems continue. HOME CARE INSTRUCTIONS   Apply ice to the sore area for 15 to 20 minutes, 3 to 4 times per day for the first 2 days. Put the ice in a plastic bag. Place a towel between the bag of ice and your skin.   If you have or were given a shoulder sling and straps, do not remove for as long as directed by your caregiver or until you see a caregiver for a follow-up examination. If you need to remove it to shower or bathe, move your arm as little as possible.   Sleep on several pillows at night to lessen swelling and pain.   Only take over-the-counter or prescription medicines for pain, discomfort, or fever as directed by your caregiver.   Keep any follow-up appointments in order to avoid any type of permanent shoulder disability or chronic pain problems.  SEEK MEDICAL CARE IF:   Pain in your shoulder increases or new pain develops in your arm, hand, or fingers.   Your hand or fingers are colder than your other hand.   You do not obtain pain relief with the medications or your pain becomes worse.  SEEK IMMEDIATE MEDICAL CARE IF:   Your arm, hand, or fingers are numb or tingling.   Your arm, hand, or fingers are swollen, painful, or turn white or blue.   You develop chest pain or shortness of breath.  MAKE SURE YOU:   Understand these instructions.   Will watch your condition.   Will get help right away if you are not doing well or get worse.    Document Released: 07/31/2005 Document Revised: 10/10/2011 Document Reviewed: 10/05/2011 [employer] Patient Information 2012 [employer]. 

## 2012-02-09 NOTE — ED Notes (Signed)
Tripped and fell this am, landed on left shoulder, cont. To have pain and limited movement of arm, denies any other injury

## 2012-02-09 NOTE — ED Notes (Signed)
Pt presents with left shoulder pain after tripping and falling on porch today. No obvious deformity but pt is obese and has large shoulders. Pt tearful in treatment room. Pt with decreased ROM. Pt side lying in stretcher. Stretcher in low locked position. Side rail up for pt safety. Call light within reach. Education on plan of care provided. Pt verbalized understanding. Awaiting xray and further orders. Pt medicated for pain. Will continue to monitor.

## 2012-02-09 NOTE — ED Notes (Signed)
Pt a/ox4. Resp even and unlabored. NAD at this time. D/C instructions reviewed with pt. Pt verbalized understanding. Pt ambulated to lobby with steady gate.  

## 2012-02-12 NOTE — ED Provider Notes (Signed)
History     CSN: [id number]  Arrival date & time 02/09/12  1000   First MD Initiated Contact with Patient 02/09/12 1023      Chief Complaint  Patient presents with  . Shoulder Injury    (Consider location/radiation/quality/duration/timing/severity/associated sxs/prior treatment) Patient is a 46 y.o. male presenting with shoulder injury. The history is provided by the patient.  Shoulder Injury This is a new problem. The current episode started today. The problem occurs constantly. The problem has been unchanged. Associated symptoms include arthralgias. Pertinent negatives include no abdominal pain, chest pain, fever, headaches, joint swelling, neck pain, numbness or weakness. Exacerbated by: movement or palpation of the shoulder joint. He has tried nothing for the symptoms. The treatment provided no relief.    Past Medical History  Diagnosis Date  . Back pain   . Asthma   . Cellulitis of left leg   . Kidney stones   . Obesity   . Homeless   . Polysubstance abuse     History reviewed. No pertinent past surgical history.  Family History  Problem Relation Age of Onset  . Cancer Mother   . Emphysema Mother   . Diabetes type II Mother   . Heart failure Mother   . Cancer Father   . Diabetes type II Father     History  Substance Use Topics  . Smoking status: Never Smoker   . Smokeless tobacco: Not on file  . Alcohol Use: No      Review of Systems  Constitutional: Negative for fever.  HENT: Negative for facial swelling and neck pain.   Respiratory: Negative for chest tightness and shortness of breath.   Cardiovascular: Negative for chest pain.  Gastrointestinal: Negative for abdominal pain.  Musculoskeletal: Positive for arthralgias. Negative for back pain and joint swelling.  Skin: Negative.   Neurological: Negative for dizziness, weakness, numbness and headaches.  All other systems reviewed and are negative.    Allergies  Aspirin and Penicillins  Home  Medications   Current Outpatient Rx  Name Route Sig Dispense Refill  . ALBUTEROL 90 MCG/ACT IN AERS Inhalation Inhale 2 puffs into the lungs every 6 (six) hours as needed. Wheezing     . OXYCODONE-ACETAMINOPHEN 5-325 MG PO TABS Oral Take 1 tablet by mouth every 4 (four) hours as needed for pain. 20 tablet 0    BP 148/86  Pulse 66  Temp(Src) 97.8 F (36.6 C) (Oral)  Resp 26  Ht 6\' 1"  (1.854 m)  Wt 467 lb (211.83 kg)  BMI 61.61 kg/m2  SpO2 100%  Physical Exam  Nursing note and vitals reviewed. Constitutional: He is oriented to person, place, and time. He appears well-developed and well-nourished. No distress.       morbidly obese  Eyes: EOM are normal. Pupils are equal, round, and reactive to light.  Neck: Normal range of motion. Neck supple.  Cardiovascular: Normal rate, regular rhythm, normal heart sounds and intact distal pulses.   No murmur heard. Pulmonary/Chest: Effort normal and breath sounds normal. No respiratory distress.  Musculoskeletal: He exhibits no edema.       Left shoulder: He exhibits decreased range of motion, tenderness, bony tenderness and pain. He exhibits no swelling, no effusion, no crepitus, no deformity, no laceration, no spasm, normal pulse and normal strength.       Arms:      ttp of the anterolateral left shoulder   Lymphadenopathy:    He has no cervical adenopathy.  Neurological: He is alert and  oriented to person, place, and time. No cranial nerve deficit or sensory deficit. He exhibits normal muscle tone. Coordination normal. GCS eye subscore is 4. GCS verbal subscore is 5. GCS motor subscore is 6.  Reflex Scores:      Tricep reflexes are 2+ on the right side and 2+ on the left side.      Bicep reflexes are 2+ on the right side and 2+ on the left side.      Brachioradialis reflexes are 2+ on the right side and 2+ on the left side. Skin: Skin is warm and dry.    ED Course  Procedures (including critical care time)  Dg Shoulder Left  02/09/2012   *RADIOLOGY REPORT*  Clinical Data: Shoulder injury  LEFT SHOULDER - 2+ VIEW  Comparison: 07/26/2009  Findings: There are mild degenerative changes involving the acromioclavicular and glenohumeral joints.  There is no evidence for acute fracture or subluxation.  No radiopaque foreign bodies or soft tissue calcifications.  IMPRESSION:  1.  Osteoarthritis. 2.  No acute findings.  Original Report Authenticated By: Rosealee Albee, M.D.    1. Shoulder pain, left       MDM  IM dilaudid and po percocet given in ED   ttp of the anterolateral left shoulder.  Pain is reproduced with abduction of the arm.  Distal sensation intact, radial pulse is brisk, CR<2 sec.  Possible rotator cuff injury.  Pt agrees to f/u with orthopedics.  No neck pain or focal neuro deficits     Will prescribe pain medication, arm placed in a sling for comfort.     Patient / Family / Caregiver understand and agree with initial ED impression and plan with expectations set for ED visit. Pt stable in ED with no significant deterioration in condition. Pt feels improved after observation and/or treatment in ED.    Domique Clapper L. Arietta Eisenstein, [place] 02/12/12 1606

## 2012-02-14 NOTE — ED Provider Notes (Signed)
Medical screening examination/treatment/procedure(s) were performed by non-physician practitioner and as supervising physician I was immediately available for consultation/collaboration.   Aleksander Edmiston L Cornelious Diven, MD 02/14/12 1809 

## 2012-06-03 ENCOUNTER — Emergency Department (HOSPITAL_COMMUNITY)
Admission: EM | Admit: 2012-06-03 | Discharge: 2012-06-03 | Disposition: A | Discharge status: Home / Self Care | Payer: Medicaid Other | Attending: Emergency Medicine | Admitting: Emergency Medicine

## 2012-06-03 ENCOUNTER — Emergency Department (HOSPITAL_COMMUNITY): Payer: Medicaid Other

## 2012-06-03 ENCOUNTER — Encounter (HOSPITAL_COMMUNITY): Payer: Self-pay | Admitting: Emergency Medicine

## 2012-06-03 DIAGNOSIS — S40019A Contusion of unspecified shoulder, initial encounter: Secondary | ICD-10-CM | POA: Insufficient documentation

## 2012-06-03 DIAGNOSIS — M545 Low back pain, unspecified: Secondary | ICD-10-CM | POA: Insufficient documentation

## 2012-06-03 DIAGNOSIS — E669 Obesity, unspecified: Secondary | ICD-10-CM | POA: Insufficient documentation

## 2012-06-03 DIAGNOSIS — J45909 Unspecified asthma, uncomplicated: Secondary | ICD-10-CM | POA: Insufficient documentation

## 2012-06-03 DIAGNOSIS — W010XXA Fall on same level from slipping, tripping and stumbling without subsequent striking against object, initial encounter: Secondary | ICD-10-CM | POA: Insufficient documentation

## 2012-06-03 DIAGNOSIS — Y92009 Unspecified place in unspecified non-institutional (private) residence as the place of occurrence of the external cause: Secondary | ICD-10-CM | POA: Insufficient documentation

## 2012-06-03 DIAGNOSIS — S39012A Strain of muscle, fascia and tendon of lower back, initial encounter: Secondary | ICD-10-CM

## 2012-06-03 DIAGNOSIS — S335XXA Sprain of ligaments of lumbar spine, initial encounter: Secondary | ICD-10-CM | POA: Insufficient documentation

## 2012-06-03 DIAGNOSIS — Y93E1 Activity, personal bathing and showering: Secondary | ICD-10-CM | POA: Insufficient documentation

## 2012-06-03 MED ORDER — OXYCODONE-ACETAMINOPHEN 5-325 MG PO TABS: 1.0000 | ORAL_TABLET | Freq: Four times a day (QID) | ORAL | Status: AC | PRN

## 2012-06-03 MED ORDER — HYDROMORPHONE HCL PF 1 MG/ML IJ SOLN
1.0000 mg | Freq: Once | INTRAMUSCULAR | Status: AC
  Administered 2012-06-03: 1 mg via INTRAMUSCULAR
  Filled 2012-06-03: qty 1

## 2012-06-03 MED ORDER — CYCLOBENZAPRINE HCL 10 MG PO TABS: 10.0000 mg | ORAL_TABLET | Freq: Three times a day (TID) | ORAL | Status: AC | PRN
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
## 2012-06-03 NOTE — ED Provider Notes (Signed)
History   This chart was scribed for Benny Lennert, MD by Charolett Bumpers . The patient was seen in room APA10/APA10. Patient's care was started at 1347.    CSN: [id number]  Arrival date & time 06/03/12  1323   First MD Initiated Contact with Patient 06/03/12 1347      Chief Complaint  Patient presents with  . Fall  . Shoulder Pain    (Consider location/radiation/quality/duration/timing/severity/associated sxs/prior treatment) HPI Comments: Albert Klein is a 46 y.o. male who presents to the Emergency Department complaining of constant, moderate left shoulder pain after slipping and falling in bath tub PTA. Pt also reports associate mid and lower back. Pt denies hitting head or LOC.    Patient is a 46 y.o. male presenting with shoulder injury. The history is provided by the patient.  Shoulder Injury This is a new problem. The current episode started 1 to 2 hours ago. The problem occurs constantly. The problem has not changed since onset.Pertinent negatives include no chest pain, no abdominal pain and no headaches. Nothing relieves the symptoms. He has tried nothing for the symptoms.     Past Medical History  Diagnosis Date  . Back pain   . Asthma   . Cellulitis of left leg   . Kidney stones   . Obesity   . Homeless   . Polysubstance abuse     History reviewed. No pertinent past surgical history.  Family History  Problem Relation Age of Onset  . Cancer Mother   . Emphysema Mother   . Diabetes type II Mother   . Heart failure Mother   . Cancer Father   . Diabetes type II Father     History  Substance Use Topics  . Smoking status: Never Smoker   . Smokeless tobacco: Not on file  . Alcohol Use: No      Review of Systems  Constitutional: Negative for fatigue.  HENT: Negative for congestion, sinus pressure and ear discharge.   Eyes: Negative for discharge.  Respiratory: Negative for cough.   Cardiovascular: Negative for chest pain.    Gastrointestinal: Negative for abdominal pain and diarrhea.  Genitourinary: Negative for frequency and hematuria.  Musculoskeletal: Positive for myalgias and back pain.       Left shoulder pain.   Skin: Negative for rash.  Neurological: Negative for seizures and headaches.  Hematological: Negative.   Psychiatric/Behavioral: Negative for hallucinations.  All other systems reviewed and are negative.    Allergies  Aspirin and Penicillins  Home Medications   Current Outpatient Rx  Name Route Sig Dispense Refill  . ALBUTEROL 90 MCG/ACT IN AERS Inhalation Inhale 2 puffs into the lungs every 6 (six) hours as needed. Wheezing     . ONE-A-DAY ENERGY PO Oral Take 1 tablet by mouth daily.      BP 134/83  Pulse 66  Temp 97.6 F (36.4 C) (Oral)  Resp 21  SpO2 98%  Physical Exam  Constitutional: He is oriented to person, place, and time. He appears well-developed.  HENT:  Head: Normocephalic and atraumatic.  Eyes: Conjunctivae and EOM are normal. No scleral icterus.  Neck: Neck supple. No thyromegaly present.  Cardiovascular: Normal rate and regular rhythm.  Exam reveals no gallop and no friction rub.   No murmur heard. Pulmonary/Chest: No stridor. He has no wheezes. He has no rales. He exhibits no tenderness.  Abdominal: He exhibits no distension. There is no tenderness. There is no rebound.  Musculoskeletal: He exhibits no edema.  Tender left shoulder with decrease rom.  Tender lumbar spine  Lymphadenopathy:    He has no cervical adenopathy.  Neurological: He is oriented to person, place, and time. Coordination normal.  Skin: No rash noted. No erythema.  Psychiatric: He has a normal mood and affect. His behavior is normal.    ED Course  Procedures (including critical care time)   DIAGNOSTIC STUDIES: Oxygen Saturation is 98% on room air, normal by my interpretation.    COORDINATION OF CARE:  14:00-Medication Orders: Hydromorphone (Dilaudid) injection 1 mg-once.    14:56-Recheck: Pt reports no improvement of pain after pain medications. Will place shoulder in sling and d/c. Discussed f/u with orthopedics.   Labs Reviewed - No data to display Dg Lumbar Spine Complete  06/03/2012  *RADIOLOGY REPORT*  Clinical Data: Fall and low back pain.  LUMBAR SPINE - COMPLETE 4+ VIEW  Comparison: None.  Findings: AP, lateral and oblique images of the lumbar spine were obtained. Degenerative endplate changes in the lower thoracic spine.  L5 may represent a transitional vertebral body.  There appears to be disc space loss at L5-S1 and there may be anterolisthesis at this level.  Cannot exclude pars defects at L5.  IMPRESSION: No acute bony abnormalities.  Degenerative changes in the lower thoracic spine and at the lumbosacral junction.  Possible anterolisthesis at L5-S1 and cannot exclude pars defects.  Original Report Authenticated By: Richarda Overlie, M.D.   Dg Shoulder Left  06/03/2012  *RADIOLOGY REPORT*  Clinical Data: Shoulder pain and fall.  LEFT SHOULDER - 2+ VIEW  Comparison: 02/09/2012  Findings: Three views of the left shoulder were obtained. Degenerative changes involving the proximal humerus.  The left AC joint appears intact.  Visualized left lung is clear without pneumothorax.  The scapular Y view is limited and posterior subluxation cannot be excluded based on this image. There is a subtle cortical step-off involving the superior proximal humerus which could be related to degenerative changes but cannot exclude a subtle fracture at this area.  IMPRESSION: Study has limitations due to the patient's body habitus and a posterior shoulder subluxation cannot be excluded.  Mild cortical irregularity involving the proximal humerus is likely degenerative in nature but a subtle fracture cannot be excluded. These findings could be further evaluated with CT.  Original Report Authenticated By: Richarda Overlie, M.D.     No diagnosis found.    MDM      The chart was scribed for  me under my direct supervision.  I personally performed the history, physical, and medical decision making and all procedures in the evaluation of this patient.Benny Lennert, MD 06/03/12 1501

## 2013-01-13 ENCOUNTER — Emergency Department (HOSPITAL_COMMUNITY)
Admission: EM | Admit: 2013-01-13 | Discharge: 2013-01-13 | Disposition: A | Discharge status: Home / Self Care | Payer: Medicaid Other | Attending: Emergency Medicine | Admitting: Emergency Medicine

## 2013-01-13 ENCOUNTER — Encounter (HOSPITAL_COMMUNITY): Payer: Self-pay

## 2013-01-13 ENCOUNTER — Emergency Department (HOSPITAL_COMMUNITY): Payer: Medicaid Other

## 2013-01-13 DIAGNOSIS — Z87442 Personal history of urinary calculi: Secondary | ICD-10-CM | POA: Insufficient documentation

## 2013-01-13 DIAGNOSIS — M79605 Pain in left leg: Secondary | ICD-10-CM

## 2013-01-13 DIAGNOSIS — M549 Dorsalgia, unspecified: Secondary | ICD-10-CM | POA: Insufficient documentation

## 2013-01-13 DIAGNOSIS — J45909 Unspecified asthma, uncomplicated: Secondary | ICD-10-CM | POA: Insufficient documentation

## 2013-01-13 DIAGNOSIS — M79609 Pain in unspecified limb: Secondary | ICD-10-CM | POA: Insufficient documentation

## 2013-01-13 DIAGNOSIS — IMO0001 Reserved for inherently not codable concepts without codable children: Secondary | ICD-10-CM | POA: Insufficient documentation

## 2013-01-13 DIAGNOSIS — Z872 Personal history of diseases of the skin and subcutaneous tissue: Secondary | ICD-10-CM | POA: Insufficient documentation

## 2013-01-13 DIAGNOSIS — R209 Unspecified disturbances of skin sensation: Secondary | ICD-10-CM | POA: Insufficient documentation

## 2013-01-13 DIAGNOSIS — R6883 Chills (without fever): Secondary | ICD-10-CM | POA: Insufficient documentation

## 2013-01-13 LAB — LACTIC ACID, PLASMA: Lactic Acid, Venous: 1.4 mmol/L (ref 0.5–2.2)

## 2013-01-13 LAB — CBC WITH DIFFERENTIAL/PLATELET
Basophils Absolute: 0 10*3/uL (ref 0.0–0.1)
Basophils Relative: 0 % (ref 0–1)
Eosinophils Absolute: 0.2 10*3/uL (ref 0.0–0.7)
Eosinophils Relative: 3 % (ref 0–5)
HCT: 38.7 % — ABNORMAL LOW (ref 39.0–52.0)
Hemoglobin: 12.8 g/dL — ABNORMAL LOW (ref 13.0–17.0)
Lymphocytes Relative: 26 % (ref 12–46)
Lymphs Abs: 1.4 10*3/uL (ref 0.7–4.0)
MCH: 29.2 pg (ref 26.0–34.0)
MCHC: 33.1 g/dL (ref 30.0–36.0)
MCV: 88.2 fL (ref 78.0–100.0)
Monocytes Absolute: 0.4 10*3/uL (ref 0.1–1.0)
Monocytes Relative: 7 % (ref 3–12)
Neutro Abs: 3.5 10*3/uL (ref 1.7–7.7)
Neutrophils Relative %: 64 % (ref 43–77)
Platelets: 239 10*3/uL (ref 150–400)
RBC: 4.39 MIL/uL (ref 4.22–5.81)
RDW: 13.9 % (ref 11.5–15.5)
WBC: 5.5 10*3/uL (ref 4.0–10.5)

## 2013-01-13 LAB — BASIC METABOLIC PANEL
BUN: 14 mg/dL (ref 6–23)
CO2: 23 mEq/L (ref 19–32)
Calcium: 9.4 mg/dL (ref 8.4–10.5)
Chloride: 102 mEq/L (ref 96–112)
Creatinine, Ser: 1.2 mg/dL (ref 0.50–1.35)
GFR calc Af Amer: 83 mL/min — ABNORMAL LOW (ref >90)
GFR calc non Af Amer: 72 mL/min — ABNORMAL LOW (ref >90)
Glucose, Bld: 95 mg/dL (ref 70–99)
Potassium: 4.1 mEq/L (ref 3.5–5.1)
Sodium: 136 mEq/L (ref 135–145)

## 2013-01-13 MED ORDER — HYDROMORPHONE HCL PF 1 MG/ML IJ SOLN
1.0000 mg | Freq: Once | INTRAMUSCULAR | Status: AC
  Administered 2013-01-13: 1 mg via INTRAMUSCULAR
  Filled 2013-01-13: qty 1

## 2013-01-13 MED ORDER — OXYCODONE-ACETAMINOPHEN 5-325 MG PO TABS: 1.0000 | ORAL_TABLET | ORAL | Status: DC | PRN

## 2013-01-13 NOTE — ED Notes (Signed)
Pt reports history of cellulitis.  Reports started having pain and chills early this morning.

## 2013-01-13 NOTE — ED Notes (Signed)
Patient called out for something for pain.

## 2013-01-13 NOTE — ED Notes (Signed)
Patient states he needs more pain medicine at this time. RN made aware.

## 2013-01-13 NOTE — ED Notes (Signed)
Patient refuses to ambulate.  Patient stated his left leg "hurts too bad" and he cannot put any weight on that leg at all.  Notified nurse

## 2013-01-13 NOTE — ED Provider Notes (Signed)
History     CSN: [id number]  Arrival date & time 01/13/13  1115   First MD Initiated Contact with Patient 01/13/13 1201      Chief Complaint  Patient presents with  . Leg Pain    (Consider location/radiation/quality/duration/timing/severity/associated sxs/prior treatment) Patient is a 47 y.o. male presenting with leg pain. The history is provided by the patient. No language interpreter was used.  Leg Pain Location:  Leg Injury: no   Leg location:  L leg Pain details:    Quality:  Aching, burning, shooting and throbbing   Radiates to:  Does not radiate   Severity:  Severe   Onset quality:  Gradual   Timing:  Constant   Progression:  Unchanged Chronicity:  New Dislocation: no   Foreign body present:  No foreign bodies Tetanus status:  Unknown Prior injury to area:  No Relieved by:  Nothing Worsened by:  Activity and bearing weight Ineffective treatments:  None tried Associated symptoms: back pain and tingling   Associated symptoms: no fever, no muscle weakness and no numbness   Risk factors: obesity   Risk factors: no concern for non-accidental trauma, no frequent fractures, no known bone disorder and no recent illness     Past Medical History  Diagnosis Date  . Back pain   . Asthma   . Cellulitis of left leg   . Kidney stones   . Obesity   . Homeless   . Polysubstance abuse     History reviewed. No pertinent past surgical history.  Family History  Problem Relation Age of Onset  . Cancer Mother   . Emphysema Mother   . Diabetes type II Mother   . Heart failure Mother   . Cancer Father   . Diabetes type II Father     History  Substance Use Topics  . Smoking status: Never Smoker   . Smokeless tobacco: Not on file  . Alcohol Use: No     Comment: former      Review of Systems  Constitutional: Positive for chills. Negative for fever.  HENT: Negative.   Respiratory: Negative.   Cardiovascular: Negative.   Gastrointestinal: Negative for nausea and  vomiting.  Musculoskeletal: Positive for myalgias and back pain.  Skin: Negative.   Psychiatric/Behavioral: Negative.   All other systems reviewed and are negative.    Allergies  Aspirin and Penicillins  Home Medications   Current Outpatient Rx  Name  Route  Sig  Dispense  Refill  . acetaminophen (TYLENOL) 500 MG tablet   Oral   Take 1,000 mg by mouth every 6 (six) hours as needed for pain.           BP 127/77  Pulse 67  Temp(Src) 97.9 F (36.6 C) (Oral)  Resp 22  Ht 6\' 1"  (1.854 m)  Wt 485 lb (219.995 kg)  BMI 64 kg/m2  SpO2 99%  Physical Exam  Nursing note and vitals reviewed. Constitutional: He is oriented to person, place, and time. He appears distressed.  Morbidly obese male laying in hospital bed  HENT:  Head: Normocephalic and atraumatic.  Eyes: EOM are normal. Pupils are equal, round, and reactive to light.  Neck: Normal range of motion. Neck supple.  Cardiovascular: Normal rate, regular rhythm and normal heart sounds.   Pulmonary/Chest: Effort normal and breath sounds normal. No respiratory distress. He has no wheezes. He has no rales. He exhibits no tenderness.  Musculoskeletal: He exhibits tenderness. He exhibits no edema.  Left foot up to knee:  severe TTP, neuro/vasculature in tact.  No visible swelling.  Left and Right leg appear symmetric.  Neurological: He is alert and oriented to person, place, and time.  Skin: Skin is warm and dry. No rash noted. He is not diaphoretic. No erythema. No pallor.  Left leg: no erythema, no warmth, no streaking    ED Course  Procedures (including critical care time)  Labs Reviewed  CBC WITH DIFFERENTIAL - Abnormal; Notable for the following:    Hemoglobin 12.8 (*)    HCT 38.7 (*)    All other components within normal limits  BASIC METABOLIC PANEL - Abnormal; Notable for the following:    GFR calc non Af Amer 72 (*)    GFR calc Af Amer 83 (*)    All other components within normal limits  LACTIC ACID, PLASMA    No results found.   No diagnosis found.    MDM  Pt is a 47yo male with hx of cellulitis presenting with left leg pain.  States he was hospitalized x1wk about a year ago for cellulitis.  Not currently taking any medications.  Has not tried anything for the pain.  Denies trauma, recent illness, nausea/vomting, or known fever.    Pain out of proportion to PE.    Labs: not indicative of infectious process  Ordered U/S of left leg.   Signed out to Dr. Lynelle Doctor.    Junius Finner, PA-C 01/13/13 1517

## 2013-01-13 NOTE — ED Notes (Signed)
Patient called out again stating his pain shot did not help with his pain and would like something else for pain.

## 2013-01-13 NOTE — ED Provider Notes (Signed)
Medical screening examination/treatment/procedure(s) were conducted as a shared visit with non-physician practitioner(s) and myself.  I personally evaluated the patient during the encounter  Pt improved.  He has mild tenderness at this time over left ankle.  No signs of trauma and denies fall.  Denies radicular pain from back.  Left achilles appears intact.  No signs of cellulitis or deep space infection.  He requests crutches and ace wrap.  Short course of percocet given  Joya Gaskins, MD 01/13/13 [phone]

## 2013-01-20 ENCOUNTER — Other Ambulatory Visit (HOSPITAL_COMMUNITY): Payer: Self-pay | Admitting: Family Medicine

## 2013-01-20 ENCOUNTER — Ambulatory Visit (HOSPITAL_COMMUNITY)
Admission: RE | Admit: 2013-01-20 | Discharge: 2013-01-20 | Disposition: A | Discharge status: Home / Self Care | Payer: Medicaid Other | Source: Ambulatory Visit | Attending: Family Medicine | Admitting: Family Medicine

## 2013-01-20 DIAGNOSIS — M5137 Other intervertebral disc degeneration, lumbosacral region: Secondary | ICD-10-CM | POA: Insufficient documentation

## 2013-01-20 DIAGNOSIS — M5030 Other cervical disc degeneration, unspecified cervical region: Secondary | ICD-10-CM

## 2013-01-20 DIAGNOSIS — M51379 Other intervertebral disc degeneration, lumbosacral region without mention of lumbar back pain or lower extremity pain: Secondary | ICD-10-CM | POA: Insufficient documentation

## 2013-01-20 DIAGNOSIS — M545 Low back pain, unspecified: Secondary | ICD-10-CM | POA: Insufficient documentation

## 2013-01-20 DIAGNOSIS — IMO0002 Reserved for concepts with insufficient information to code with codable children: Secondary | ICD-10-CM | POA: Insufficient documentation

## 2013-01-20 DIAGNOSIS — M1990 Unspecified osteoarthritis, unspecified site: Secondary | ICD-10-CM

## 2013-05-20 ENCOUNTER — Other Ambulatory Visit (HOSPITAL_COMMUNITY): Payer: Self-pay | Admitting: Family Medicine

## 2013-05-20 ENCOUNTER — Ambulatory Visit (HOSPITAL_COMMUNITY)
Admission: RE | Admit: 2013-05-20 | Discharge: 2013-05-20 | Disposition: A | Discharge status: Home / Self Care | Payer: Medicaid Other | Source: Ambulatory Visit | Attending: Family Medicine | Admitting: Family Medicine

## 2013-05-20 DIAGNOSIS — M1990 Unspecified osteoarthritis, unspecified site: Secondary | ICD-10-CM

## 2013-05-20 DIAGNOSIS — M25519 Pain in unspecified shoulder: Secondary | ICD-10-CM | POA: Insufficient documentation

## 2013-11-02 ENCOUNTER — Other Ambulatory Visit (HOSPITAL_COMMUNITY): Payer: Self-pay | Admitting: Family Medicine

## 2013-11-02 DIAGNOSIS — M549 Dorsalgia, unspecified: Secondary | ICD-10-CM

## 2013-11-09 ENCOUNTER — Emergency Department (HOSPITAL_COMMUNITY): Payer: Medicaid Other

## 2013-11-09 ENCOUNTER — Encounter (HOSPITAL_COMMUNITY): Payer: Self-pay | Admitting: Emergency Medicine

## 2013-11-09 ENCOUNTER — Observation Stay (HOSPITAL_COMMUNITY)
Admission: EM | Admit: 2013-11-09 | Discharge: 2013-11-10 | Disposition: A | Discharge status: Home / Self Care | Payer: Medicaid Other | Attending: Family Medicine | Admitting: Family Medicine

## 2013-11-09 DIAGNOSIS — R262 Difficulty in walking, not elsewhere classified: Secondary | ICD-10-CM

## 2013-11-09 DIAGNOSIS — M25559 Pain in unspecified hip: Principal | ICD-10-CM | POA: Insufficient documentation

## 2013-11-09 DIAGNOSIS — K219 Gastro-esophageal reflux disease without esophagitis: Secondary | ICD-10-CM

## 2013-11-09 DIAGNOSIS — M25551 Pain in right hip: Secondary | ICD-10-CM | POA: Diagnosis present

## 2013-11-09 LAB — BASIC METABOLIC PANEL
BUN: 18 mg/dL (ref 6–23)
CALCIUM: 9.8 mg/dL (ref 8.4–10.5)
CO2: 27 meq/L (ref 19–32)
Chloride: 100 mEq/L (ref 96–112)
Creatinine, Ser: 1.2 mg/dL (ref 0.50–1.35)
GFR calc Af Amer: 82 mL/min — ABNORMAL LOW (ref >90)
GFR, EST NON AFRICAN AMERICAN: 71 mL/min — AB (ref >90)
GLUCOSE: 92 mg/dL (ref 70–99)
Potassium: 4.4 mEq/L (ref 3.7–5.3)
Sodium: 138 mEq/L (ref 137–147)

## 2013-11-09 LAB — CBC WITH DIFFERENTIAL/PLATELET
Basophils Absolute: 0 10*3/uL (ref 0.0–0.1)
Basophils Relative: 0 % (ref 0–1)
EOS ABS: 0.1 10*3/uL (ref 0.0–0.7)
EOS PCT: 2 % (ref 0–5)
HEMATOCRIT: 40.2 % (ref 39.0–52.0)
HEMOGLOBIN: 13.1 g/dL (ref 13.0–17.0)
LYMPHS ABS: 1.3 10*3/uL (ref 0.7–4.0)
LYMPHS PCT: 23 % (ref 12–46)
MCH: 29 pg (ref 26.0–34.0)
MCHC: 32.6 g/dL (ref 30.0–36.0)
MCV: 88.9 fL (ref 78.0–100.0)
MONO ABS: 0.3 10*3/uL (ref 0.1–1.0)
MONOS PCT: 6 % (ref 3–12)
Neutro Abs: 3.9 10*3/uL (ref 1.7–7.7)
Neutrophils Relative %: 68 % (ref 43–77)
Platelets: 218 10*3/uL (ref 150–400)
RBC: 4.52 MIL/uL (ref 4.22–5.81)
RDW: 14 % (ref 11.5–15.5)
WBC: 5.6 10*3/uL (ref 4.0–10.5)

## 2013-11-09 MED ORDER — NABUMETONE 500 MG PO TABS
500.0000 mg | ORAL_TABLET | Freq: Two times a day (BID) | ORAL | Status: DC
  Administered 2013-11-09 – 2013-11-10 (×2): 500 mg via ORAL
  Filled 2013-11-09 (×6): qty 1

## 2013-11-09 MED ORDER — ALBUTEROL SULFATE (2.5 MG/3ML) 0.083% IN NEBU: 3.0000 mL | INHALATION_SOLUTION | Freq: Four times a day (QID) | RESPIRATORY_TRACT | Status: DC | PRN

## 2013-11-09 MED ORDER — ENOXAPARIN SODIUM 100 MG/ML ~~LOC~~ SOLN
95.0000 mg | SUBCUTANEOUS | Status: DC
  Administered 2013-11-09: 95 mg via SUBCUTANEOUS
  Filled 2013-11-09: qty 1

## 2013-11-09 MED ORDER — IBUPROFEN 800 MG PO TABS
800.0000 mg | ORAL_TABLET | Freq: Once | ORAL | Status: AC
  Administered 2013-11-09: 800 mg via ORAL
  Filled 2013-11-09: qty 1

## 2013-11-09 MED ORDER — OXYCODONE HCL 5 MG PO TABS
15.0000 mg | ORAL_TABLET | Freq: Three times a day (TID) | ORAL | Status: DC
  Administered 2013-11-09 – 2013-11-10 (×3): 15 mg via ORAL
  Filled 2013-11-09 (×3): qty 3

## 2013-11-09 MED ORDER — ONDANSETRON HCL 4 MG/2ML IJ SOLN: 4.0000 mg | Freq: Three times a day (TID) | INTRAMUSCULAR | Status: AC | PRN

## 2013-11-09 MED ORDER — DEXAMETHASONE SODIUM PHOSPHATE 10 MG/ML IJ SOLN
10.0000 mg | Freq: Once | INTRAMUSCULAR | Status: AC
  Administered 2013-11-09: 10 mg via INTRAMUSCULAR
  Filled 2013-11-09: qty 1

## 2013-11-09 MED ORDER — PREGABALIN 75 MG PO CAPS
75.0000 mg | ORAL_CAPSULE | Freq: Two times a day (BID) | ORAL | Status: DC
  Administered 2013-11-09 – 2013-11-10 (×2): 75 mg via ORAL
  Filled 2013-11-09 (×2): qty 1

## 2013-11-09 MED ORDER — ALBUTEROL SULFATE HFA 108 (90 BASE) MCG/ACT IN AERS: 2.0000 | INHALATION_SPRAY | Freq: Four times a day (QID) | RESPIRATORY_TRACT | Status: DC | PRN

## 2013-11-09 MED ORDER — HYDROMORPHONE HCL PF 1 MG/ML IJ SOLN
1.0000 mg | Freq: Once | INTRAMUSCULAR | Status: AC
  Administered 2013-11-09: 1 mg via INTRAVENOUS
  Filled 2013-11-09: qty 1

## 2013-11-09 MED ORDER — HYDROMORPHONE HCL PF 1 MG/ML IJ SOLN
1.0000 mg | INTRAMUSCULAR | Status: AC | PRN
  Administered 2013-11-09 – 2013-11-10 (×3): 1 mg via INTRAVENOUS
  Filled 2013-11-09 (×3): qty 1

## 2013-11-09 MED ORDER — HYDROMORPHONE HCL PF 1 MG/ML IJ SOLN
1.0000 mg | Freq: Once | INTRAMUSCULAR | Status: AC
  Administered 2013-11-09: 1 mg via INTRAMUSCULAR
  Filled 2013-11-09: qty 1

## 2013-11-09 NOTE — Progress Notes (Signed)
ANTICOAGULATION CONSULT NOTE - Initial Consult  Pharmacy Consult for Lovenox Indication: VTE prophylaxis  Allergies  Allergen Reactions  . Aspirin Itching       . Penicillins Hives and Nausea And Vomiting    Patient Measurements: Height: 6\' 1"  (185.4 cm) Weight: 423 lb 11.6 oz (192.2 kg) IBW/kg (Calculated) : 79.9 Heparin Dosing Weight:   Vital Signs: Temp: 97.3 F (36.3 C) (01/06 1919) Temp src: Oral (01/06 1919) BP: 138/84 mmHg (01/06 1919) Pulse Rate: 61 (01/06 1919)  Labs:  Recent Labs  11/09/13 1624  HGB 13.1  HCT 40.2  PLT 218  CREATININE 1.20    Estimated Creatinine Clearance: 135.8 ml/min (by C-G formula based on Cr of 1.2).   Medical History: Past Medical History  Diagnosis Date  . Back pain   . Asthma   . Cellulitis of left leg   . Kidney stones   . Obesity   . Homeless   . Polysubstance abuse     Medications:  Scheduled:  . enoxaparin (LOVENOX) injection  95 mg Subcutaneous Q24H  . nabumetone  500 mg Oral BID  . oxyCODONE  15 mg Oral TID PC & HS  . pregabalin  75 mg Oral BID    Assessment: Admitted for hip pain Obese patient Excellent renal function  Goal of Therapy:  DVT prophylaxis dosing Monitor platelets by anticoagulation protocol: Yes   Plan:  Lovenox 95 mg SQ every 24 hours (0.5 mg/kg/day) Monitor renal function Labs per protocol  Raquel JamesPittman, Karey Stucki Bennett 11/09/2013,9:52 PM

## 2013-11-09 NOTE — ED Notes (Signed)
Attempted to get pt to ambulate in room, very difficult for pt to sit up and stand, pt unable to take any steps while up

## 2013-11-09 NOTE — ED Notes (Signed)
Pt reports falling several weeks ago and injured his right hip.  Has been to his pmd, told he needed xrays, and has been unable to come to get the xray.

## 2013-11-09 NOTE — ED Provider Notes (Signed)
CSN: [id number]     Arrival date & time 11/09/13  1314 History  This chart was scribed for Enid Skeens, MD,  by Ashley Jacobs, ED Scribe. The patient was seen in room APFT24/APFT24 and the patient's care was started at 3:00 PM.    First MD Initiated Contact with Patient 11/09/13 1440     Chief Complaint  Patient presents with  . Hip Pain   (Consider location/radiation/quality/duration/timing/severity/associated sxs/prior Treatment) The history is provided by the patient and medical records. No language interpreter was used.   HPI Comments: Albert Klein is a 48 y.o. male who presents to the Emergency Department complaining of hip pain after a fall that several weeks ago. Pt was seen by Dr. Janna Arch and was told he need x-rays. Pt states he was unable to have x-ray performed due to lack of finances. He is experiencing right leg pain that is throbbing and radiating to his right buttock. He mentions his foot has paraesthesia and numbness. Pt states the pain is "unbearable" and worse with exertion or bearing weight. Denies back pain. Denies prior surgeries. Pt is allergic to penicillins and Asprin.    Past Medical History  Diagnosis Date  . Back pain   . Asthma   . Cellulitis of left leg   . Kidney stones   . Obesity   . Homeless   . Polysubstance abuse    History reviewed. No pertinent past surgical history. Family History  Problem Relation Age of Onset  . Cancer Mother   . Emphysema Mother   . Diabetes type II Mother   . Heart failure Mother   . Cancer Father   . Diabetes type II Father    History  Substance Use Topics  . Smoking status: Never Smoker   . Smokeless tobacco: Not on file  . Alcohol Use: No     Comment: former    Review of Systems  Constitutional: Negative for fever and chills.  Gastrointestinal: Positive for nausea, vomiting and diarrhea.  Genitourinary: Negative for dysuria and difficulty urinating.  Musculoskeletal: Positive for arthralgias, gait  problem and myalgias. Negative for back pain.  Neurological: Negative for weakness.  All other systems reviewed and are negative.    Allergies  Aspirin and Penicillins  Home Medications   Current Outpatient Rx  Name  Route  Sig  Dispense  Refill  . acetaminophen (TYLENOL) 500 MG tablet   Oral   Take 1,000 mg by mouth every 6 (six) hours as needed for pain.         Marland Kitchen oxyCODONE-acetaminophen (PERCOCET/ROXICET) 5-325 MG per tablet   Oral   Take 1 tablet by mouth every 4 (four) hours as needed for pain.   3 tablet   0    BP 113/96  Pulse 65  Temp(Src) 97.6 F (36.4 C) (Oral)  Resp 20  Ht 6' (1.829 m)  Wt 485 lb (219.995 kg)  BMI 65.76 kg/m2  SpO2 99% Physical Exam  Nursing note and vitals reviewed. Constitutional: He is oriented to person, place, and time. He appears well-developed and well-nourished. No distress.  Difficulty performing exam due to pain  HENT:  Head: Normocephalic and atraumatic.  Mouth/Throat: Oropharynx is clear and moist.  Eyes: Conjunctivae are normal. Pupils are equal, round, and reactive to light. No scleral icterus.  Neck: Neck supple.  Cardiovascular: Normal rate.   No murmur heard. Pulmonary/Chest: Effort normal and breath sounds normal. No stridor. No respiratory distress. He has no wheezes. He has no rales.  Abdominal: Soft. He exhibits no distension. There is no tenderness.  Musculoskeletal: Normal range of motion. He exhibits tenderness. He exhibits no edema.  Tender to the right lateral hip, tenderness to the right gluteal maximus  sensation intact but numb Flexion and extension of knee decreased secondary to pain differential sciatica or bursitis vs occult hip fracture  Neurological: He is alert and oriented to person, place, and time. GCS eye subscore is 4. GCS verbal subscore is 5. GCS motor subscore is 6.  Normal strength w f/e of hip, knee and great toe that gives way due to pain Difficult exam due to body habitus and  pain Sensation intact LEs   Skin: Skin is warm and dry. No rash noted.  Psychiatric: He has a normal mood and affect. His behavior is normal.    ED Course  Procedures (including critical care time) DIAGNOSTIC STUDIES: Oxygen Saturation is 99% on room air, normal by my interpretation.    COORDINATION OF CARE:  3:07 PM Discussed course of care with pt . Pt understands and agrees.    Labs Review Labs Reviewed  BASIC METABOLIC PANEL - Abnormal; Notable for the following:    GFR calc non Af Amer 71 (*)    GFR calc Af Amer 82 (*)    All other components within normal limits  CBC WITH DIFFERENTIAL   Imaging Review Dg Hip Complete Right  11/09/2013   CLINICAL DATA:  Right hip pain since a fall 1-1/2 weeks ago.  EXAM: RIGHT HIP - COMPLETE 2+ VIEW  COMPARISON:  None.  FINDINGS: There is no evidence of hip fracture or dislocation. There is no evidence of arthropathy or other focal bone abnormality.  IMPRESSION: Negative exam.   Electronically Signed   By: Drusilla Kannerhomas  Dalessio M.D.   On: 11/09/2013 15:11    EKG Interpretation   None       MDM   1. Acute right hip pain   2. Unable to walk    I personally performed the services described in this documentation, which was scribed in my presence. The recorded information has been reviewed and is accurate.  Difficult exam due to pain.  Diff bursitis vs ocult hip fx vs sciatica vs chronic pain.  Pt was walking on it for 10 days so unlikely fx however pt very obese.  Pt did improve on exam however at bedside unable to bear weight on leg. Unable to obtain CT hip due to weight.  With narcotic hx difficult to discern if acute vs chronic but since pt unable to walk consulted social work and Discussed with Dr Felecia ShellingFanta on call who agreed with observation, pain control.  Repeat pain meds given.  The patients results and plan were reviewed and discussed.   Any x-rays performed were personally reviewed by myself.   Differential diagnosis were  considered with the presenting HPI.  Diagnosis: right hip pain Admission/ observation were discussed with the admitting physician, patient and/or family and they are comfortable with the plan.    Enid SkeensJoshua M Vineeth Fell, MD 11/09/13 [phone]

## 2013-11-09 NOTE — H&P (Signed)
ALEXZANDER DOLINGER MRN: [id number] DOB/AGE: Mar 20, 1966 48 y.o. Primary Care Physician:DONDIEGO,RICHARD M, MD Admit date: 11/09/2013 Chief Complaint:  Rt hip pain HPI:  This is a 48 years old morbidly obese with multiple medical illnesses came to ER with above complaint. Patient claims that he fell and sustained pain in the rt hip. His x-ray was negative for fracture and dislocation. No sign of external injury. No headach, fever, chills, cough, nausea, vomiting, abdominal pain, dysuria, urgency or frequency of urination.  Past Medical History  Diagnosis Date  . Back pain   . Asthma   . Cellulitis of left leg   . Kidney stones   . Obesity   . Homeless   . Polysubstance abuse    History reviewed. No pertinent past surgical history.      Family History  Problem Relation Age of Onset  . Cancer Mother   . Emphysema Mother   . Diabetes type II Mother   . Heart failure Mother   . Cancer Father   . Diabetes type II Father     Social History:  reports that he has never smoked. He does not have any smokeless tobacco history on file. He reports that he uses illicit drugs (Marijuana). He reports that he does not drink alcohol.   Allergies:  Allergies  Allergen Reactions  . Aspirin Itching       . Penicillins Hives and Nausea And Vomiting    Medications Prior to Admission  Medication Sig Dispense Refill  . albuterol (PROVENTIL HFA;VENTOLIN HFA) 108 (90 BASE) MCG/ACT inhaler Inhale 2 puffs into the lungs every 6 (six) hours as needed for wheezing or shortness of breath.      . nabumetone (RELAFEN) 500 MG tablet Take 500 mg by mouth 2 (two) times daily.      Marland Kitchen oxyCODONE (ROXICODONE) 15 MG immediate release tablet Take 15 mg by mouth 4 (four) times daily - after meals and at bedtime.      . pregabalin (LYRICA) 75 MG capsule Take 75 mg by mouth 2 (two) times daily.           XLK:GMWNU from the symptoms mentioned above,there are no other symptoms referable to all systems  reviewed.  Physical Exam: Blood pressure 138/84, pulse 61, temperature 97.3 F (36.3 C), temperature source Oral, resp. rate 20, height 6\' 1"  (1.854 m), weight 192.2 kg (423 lb 11.6 oz), SpO2 94.00%. General condition - morbidly obese HE ENT- Pupils equal and reactive                Neck supple Respiratory- decreased air entry, rhonchi CVS- S1 and S2 heard, regular ABD- morbidly obese, soft and lax, bowel sound ++ EXT- no leg edema    Recent Labs  11/09/13 1624  WBC 5.6  NEUTROABS 3.9  HGB 13.1  HCT 40.2  MCV 88.9  PLT 218    Recent Labs  11/09/13 1624  NA 138  K 4.4  CL 100  CO2 27  GLUCOSE 92  BUN 18  CREATININE 1.20  CALCIUM 9.8  lablast2(ast:2,ALT:2,alkphos:2,bilitot:2,prot:2,albumin:2)@    No results found for this or any previous visit (from the past 240 hour(s)).   Dg Hip Complete Right  11/09/2013   CLINICAL DATA:  Right hip pain since a fall 1-1/2 weeks ago.  EXAM: RIGHT HIP - COMPLETE 2+ VIEW  COMPARISON:  None.  FINDINGS: There is no evidence of hip fracture or dislocation. There is no evidence of arthropathy or other focal bone abnormality.  IMPRESSION: Negative  exam.   Electronically Signed   By: Drusilla Kannerhomas  Dalessio M.D.   On: 11/09/2013 15:11   Impression:  Active Problems:   Right hip pain     Plan: Admit for pain control Continue regular treatment Continue pain management.       Marquasha Brutus Pager [phone]  11/09/2013, 9:41 PM

## 2013-11-09 NOTE — ED Notes (Signed)
Pt with right hip since fall 1 1/2 weeks ago, states has wheelchair at home to get around with, c/o numbness to right leg and foot, denies incontinence of bladder or bowels

## 2013-11-09 NOTE — ED Notes (Signed)
Beeped to [phone].Albert Klein

## 2013-11-09 NOTE — ED Notes (Signed)
MD at bedside. 

## 2013-11-10 MED ORDER — HYDROMORPHONE HCL PF 1 MG/ML IJ SOLN
1.0000 mg | INTRAMUSCULAR | Status: DC | PRN
  Administered 2013-11-10: 1 mg via INTRAVENOUS
  Filled 2013-11-10: qty 1

## 2013-11-10 NOTE — Clinical Social Work Psychosocial (Signed)
Clinical Social Work Department BRIEF PSYCHOSOCIAL ASSESSMENT 11/10/2013  Patient:  Albert Klein, Albert Klein     Account Number:  [id number]     Admit date:  11/09/2013  Clinical Social Worker:  Wyatt Haste  Date/Time:  11/10/2013 08:40 AM  Referred by:  Physician  Date Referred:  11/10/2013 Referred for  SNF Placement   Other Referral:   Interview type:  Patient Other interview type:    PSYCHOSOCIAL DATA Living Status:  FAMILY Admitted from facility:   Level of care:   Primary support name:  Albert Klein Primary support relationship to patient:  FAMILY Degree of support available:   adequate    CURRENT CONCERNS Current Concerns  Post-Acute Placement   Other Concerns:    SOCIAL WORK ASSESSMENT / PLAN CSW met with pt at bedside along with CM. Pt alert and oriented and well known to CSW from previous admissions. Pt has since moved in with his cousin off of [place]. He reports that he fell about 10 days ago and was seen by Dr. Cindie Laroche a week ago. He was told if pain increased to come to ED. Yesterday, he came to ED with throbbing right leg pain. Xray negative. Pt continues to complain of pain. CM and CSW discussed d/c plan. Pt plans to return home with his cousin. He states that he has considered going to a [facility] in the past. He understands that he would give up most of his check and he is concerned about this. Pt states he will go home and further consider his options. CSW provided SNF and ALF lists. Pt has a walker which he uses inside the home and a wheelchair for outside the home. His transportation is limited but he is aware of [facility] and uses them for scheduled appointments if needed.   Assessment/plan status:  Referral to [employer] Other assessment/ plan:   Information/referral to community resources:   SNF list  ALF list    PATIENT'S/FAMILY'S RESPONSE TO PLAN OF CARE: Pt continues to complain of hip pain. He plans to return home to consider placement further. CSW notified MD  of plan. Will sign off. Anticipate d/c soon.     Benay Pike, [place]

## 2013-11-10 NOTE — Progress Notes (Signed)
Pt is to be discharged home today. Pt is in NAD, IV is out, all paperwork has been reviewed/discussed with patient, and there are no questions/concerns at this time. Assessment is unchanged from this morning. Pt is to be accompanied downstairs by staff and family via wheelchair.  

## 2013-11-10 NOTE — Care Management Note (Addendum)
    Page 1 of 1   11/10/2013     1:53:09 PM   CARE MANAGEMENT NOTE 11/10/2013  Patient:  Albert Klein,Albert Klein   Account Number:  [id number]  Date Initiated:  11/10/2013  Documentation initiated by:  Sharrie RothmanBLACKWELL,Yassmine Tamm C  Subjective/Objective Assessment:   Pt admitted from home with hip pain. Pt lives with his cousin and has a walker and w/c for home use. Pt stated that he has talked with [facility] about potential for admission into their facility.     Action/Plan:   Pt wants to return home at discharge. CSW has given pt list of facilities if he chooses to be placed in the future. No other CM needs noted.   Anticipated DC Date:  11/11/2013   Anticipated DC Plan:  HOME/SELF CARE  In-house referral  Clinical Social Worker      DC Planning Services  CM consult      Choice offered to / List presented to:             Status of service:  Completed, signed off Medicare Important Message given?   (If response is "NO", the following Medicare IM given date fields will be blank) Date Medicare IM given:   Date Additional Medicare IM given:    Discharge Disposition:  HOME/SELF CARE  Per UR Regulation:    If discussed at Long Length of Stay Meetings, dates discussed:    Comments:  11/10/13 1340 Albert Queenammy Natalie Mceuen, RN BSN CM Pt discharged home today. Dr. Carmina MilleronDIegos office will call and schedule MRI and call pt with appt time. Pt is aware of [facility] and has contact informatin for them to call and arrange transportation. Pt and pts nurse aware of discharge arrangements.  11/10/13 1320 Albert Queenammy Dhruvan Gullion, RN BSN CM

## 2013-11-10 NOTE — Progress Notes (Signed)
Pharmacy Consult: Lovenox for VTE prophylaxis.  Patient Measurements: Height: 6\' 1"  (185.4 cm) Weight: 423 lb 11.6 oz (192.2 kg) IBW/kg (Calculated) : 79.9 Body mass index is 55.92 kg/(m^2).   VITALS Filed Vitals:   11/10/13 0619  BP: 151/82  Pulse: 69  Temp: 97.4 F (36.3 C)  Resp: 20    INR Last Three Days: No results found for this basename: INR,  in the last 72 hours  CBC:    Component Value Date/Time   WBC 5.6 11/09/2013 1624   RBC 4.52 11/09/2013 1624   RBC 3.96* 06/26/2011 0507   HGB 13.1 11/09/2013 1624   HCT 40.2 11/09/2013 1624   PLT 218 11/09/2013 1624   MCV 88.9 11/09/2013 1624   MCH 29.0 11/09/2013 1624   MCHC 32.6 11/09/2013 1624   RDW 14.0 11/09/2013 1624   LYMPHSABS 1.3 11/09/2013 1624   MONOABS 0.3 11/09/2013 1624   EOSABS 0.1 11/09/2013 1624   BASOSABS 0.0 11/09/2013 1624    RENAL FUNCTION: Estimated Creatinine Clearance: 135.8 ml/min (by C-G formula based on Cr of 1.2).  Assessment: Dose stable for age, weight, renal function and indication.  Plan: Sign off.  Mady GemmaHayes, Marcea Rojek R, [facility] CampusRPH 11/10/2013 9:40 AM

## 2013-11-11 NOTE — Discharge Summary (Signed)
NAMOlen Cordial:  Shinsato, Clary                 ACCOUNT NO.:  [id number]  MEDICAL RECORD NO.:  [id number]  LOCATION:  A313                          FACILITY:  [facility]  PHYSICIAN:  Melvyn Novasichard Michael Ahava Kissoon, MDDATE OF BIRTH:  September 11, 1966  DATE OF ADMISSION:  11/09/2013 DATE OF DISCHARGE:  01/07/2015LH                              DISCHARGE SUMMARY   HISTORY OF PRESENT ILLNESS:  The patient is a 48 year old morbidly obese white male who weighs 420 pounds complaining of right hip pain consistent with trochanteric bursitis for 3 or 4 weeks, had a family funeral, had to go out of state, came back, presented to the hospital, was admitted the last night.  X-rays of right hip were initially unrevealing.  He does have some radicular aspects to his pain extending a little bit down his thigh perhaps in his buttocks, I am not quite certain.  Due to the fact that he weighs 420 pounds, our MRI will not work here, and we will subsequently discharge him on opioid pain medicines, steroids, and [facility] where he can be evaluated due to his body habitus.  PAST MEDICAL HISTORY:  Significant form, 1. Morbid obesity. 2. Anxiety. 3. Degenerative joint disease. 4. Presumed right lumbosacral radiculopathy.  The patient was admitted to the hospital.  Essentially he was admitted with observation, we could not obtain MRI and discharge him for financial reasons with his MRI being scheduled as an outpatient.  He has opioid analgesia at home, and I told him to take these 5 times a day as opposed to 4 times a day, which is written on the bottle.  I will refill this early when he presents to the office.  He states he has 90 pills remaining.  DISCHARGE MEDICINES: 1. Proventil HFA 2 puffs q.i.d. p.r.n. 2. Relafen 500 mg p.o. b.i.d. 3. Oxycodone 15 mg p.o. q.4 h. p.r.n. 4. Lyrica 75 mg p.o. b.i.d.  The patient will follow up in my office after we telephonically schedule MRI of his  lumbosacral spine.    Melvyn Novasichard Michael Kyara Boxer, MD    RMD/MEDQ  D:  11/10/2013  T:  11/11/2013  Job:  [id number]

## 2015-01-06 ENCOUNTER — Ambulatory Visit (HOSPITAL_COMMUNITY)
Admission: RE | Admit: 2015-01-06 | Discharge: 2015-01-06 | Disposition: A | Discharge status: Home / Self Care | Payer: Medicaid Other | Source: Ambulatory Visit | Attending: Family Medicine | Admitting: Family Medicine

## 2015-01-06 ENCOUNTER — Other Ambulatory Visit (HOSPITAL_COMMUNITY): Payer: Self-pay | Admitting: Family Medicine

## 2015-01-06 DIAGNOSIS — M25512 Pain in left shoulder: Secondary | ICD-10-CM | POA: Diagnosis not present

## 2015-01-06 DIAGNOSIS — M25511 Pain in right shoulder: Secondary | ICD-10-CM

## 2016-05-17 ENCOUNTER — Other Ambulatory Visit (HOSPITAL_COMMUNITY): Payer: Self-pay | Admitting: Family Medicine

## 2016-05-17 ENCOUNTER — Ambulatory Visit (HOSPITAL_COMMUNITY)
Admission: RE | Admit: 2016-05-17 | Discharge: 2016-05-17 | Disposition: A | Discharge status: Home / Self Care | Payer: Medicaid Other | Source: Ambulatory Visit | Attending: Family Medicine | Admitting: Family Medicine

## 2016-05-17 DIAGNOSIS — M25562 Pain in left knee: Secondary | ICD-10-CM | POA: Diagnosis present

## 2016-05-17 DIAGNOSIS — M1712 Unilateral primary osteoarthritis, left knee: Secondary | ICD-10-CM

## 2018-04-08 ENCOUNTER — Other Ambulatory Visit: Payer: Self-pay | Admitting: Nurse Practitioner

## 2018-04-08 ENCOUNTER — Ambulatory Visit
Admission: RE | Admit: 2018-04-08 | Discharge: 2018-04-08 | Disposition: A | Discharge status: Home / Self Care | Payer: Medicaid Other | Source: Ambulatory Visit | Attending: Nurse Practitioner | Admitting: Nurse Practitioner

## 2018-04-08 DIAGNOSIS — M5136 Other intervertebral disc degeneration, lumbar region: Secondary | ICD-10-CM

## 2018-06-07 ENCOUNTER — Other Ambulatory Visit: Payer: Self-pay

## 2018-06-07 ENCOUNTER — Encounter (HOSPITAL_COMMUNITY): Payer: Self-pay | Admitting: Emergency Medicine

## 2018-06-07 ENCOUNTER — Inpatient Hospital Stay (HOSPITAL_COMMUNITY)
Admission: EM | Admit: 2018-06-07 | Discharge: 2018-06-12 | DRG: 603 | Disposition: A | Discharge status: Home Health | Payer: Medicaid Other | Attending: Family Medicine | Admitting: Family Medicine

## 2018-06-07 DIAGNOSIS — J45909 Unspecified asthma, uncomplicated: Secondary | ICD-10-CM | POA: Diagnosis present

## 2018-06-07 DIAGNOSIS — Z79899 Other long term (current) drug therapy: Secondary | ICD-10-CM

## 2018-06-07 DIAGNOSIS — G8929 Other chronic pain: Secondary | ICD-10-CM | POA: Diagnosis present

## 2018-06-07 DIAGNOSIS — Z7951 Long term (current) use of inhaled steroids: Secondary | ICD-10-CM | POA: Diagnosis not present

## 2018-06-07 DIAGNOSIS — Z6841 Body Mass Index (BMI) 40.0 and over, adult: Secondary | ICD-10-CM | POA: Diagnosis not present

## 2018-06-07 DIAGNOSIS — Z79891 Long term (current) use of opiate analgesic: Secondary | ICD-10-CM

## 2018-06-07 DIAGNOSIS — D649 Anemia, unspecified: Secondary | ICD-10-CM | POA: Diagnosis present

## 2018-06-07 DIAGNOSIS — K219 Gastro-esophageal reflux disease without esophagitis: Secondary | ICD-10-CM | POA: Diagnosis present

## 2018-06-07 DIAGNOSIS — B029 Zoster without complications: Secondary | ICD-10-CM | POA: Diagnosis present

## 2018-06-07 DIAGNOSIS — L03116 Cellulitis of left lower limb: Principal | ICD-10-CM | POA: Diagnosis present

## 2018-06-07 DIAGNOSIS — B028 Zoster with other complications: Secondary | ICD-10-CM | POA: Diagnosis not present

## 2018-06-07 DIAGNOSIS — F1910 Other psychoactive substance abuse, uncomplicated: Secondary | ICD-10-CM | POA: Diagnosis not present

## 2018-06-07 HISTORY — DX: Unspecified osteoarthritis, unspecified site: M19.90

## 2018-06-07 LAB — BASIC METABOLIC PANEL
Anion gap: 7 (ref 5–15)
BUN: 18 mg/dL (ref 6–20)
CO2: 26 mmol/L (ref 22–32)
CREATININE: 1.03 mg/dL (ref 0.61–1.24)
Calcium: 8.8 mg/dL — ABNORMAL LOW (ref 8.9–10.3)
Chloride: 104 mmol/L (ref 98–111)
GFR calc Af Amer: >60 mL/min (ref >60)
GFR calc non Af Amer: >60 mL/min (ref >60)
GLUCOSE: 98 mg/dL (ref 70–99)
POTASSIUM: 4.2 mmol/L (ref 3.5–5.1)
SODIUM: 137 mmol/L (ref 135–145)

## 2018-06-07 LAB — CBC WITH DIFFERENTIAL/PLATELET
Basophils Absolute: 0 10*3/uL (ref 0.0–0.1)
Basophils Relative: 0 %
Eosinophils Absolute: 0.4 10*3/uL (ref 0.0–0.7)
Eosinophils Relative: 7 %
HEMATOCRIT: 38.1 % — AB (ref 39.0–52.0)
HEMOGLOBIN: 12 g/dL — AB (ref 13.0–17.0)
LYMPHS ABS: 1.2 10*3/uL (ref 0.7–4.0)
Lymphocytes Relative: 22 %
MCH: 28.8 pg (ref 26.0–34.0)
MCHC: 31.5 g/dL (ref 30.0–36.0)
MCV: 91.6 fL (ref 78.0–100.0)
MONO ABS: 0.5 10*3/uL (ref 0.1–1.0)
Monocytes Relative: 10 %
NEUTROS PCT: 61 %
Neutro Abs: 3.2 10*3/uL (ref 1.7–7.7)
Platelets: 209 10*3/uL (ref 150–400)
RBC: 4.16 MIL/uL — ABNORMAL LOW (ref 4.22–5.81)
RDW: 13.6 % (ref 11.5–15.5)
WBC: 5.3 10*3/uL (ref 4.0–10.5)

## 2018-06-07 LAB — LACTIC ACID, PLASMA
LACTIC ACID, VENOUS: 1.5 mmol/L (ref 0.5–1.9)
Lactic Acid, Venous: 1.1 mmol/L (ref 0.5–1.9)

## 2018-06-07 MED ORDER — IPRATROPIUM-ALBUTEROL 0.5-2.5 (3) MG/3ML IN SOLN: 3.0000 mL | Freq: Four times a day (QID) | RESPIRATORY_TRACT | Status: DC | PRN

## 2018-06-07 MED ORDER — SODIUM CHLORIDE 0.9% FLUSH: 3.0000 mL | INTRAVENOUS | Status: DC | PRN

## 2018-06-07 MED ORDER — OXYCODONE-ACETAMINOPHEN 5-325 MG PO TABS
1.0000 | ORAL_TABLET | Freq: Once | ORAL | Status: AC
  Administered 2018-06-07: 1 via ORAL
  Filled 2018-06-07: qty 1

## 2018-06-07 MED ORDER — DOCUSATE SODIUM 100 MG PO CAPS
100.0000 mg | ORAL_CAPSULE | Freq: Two times a day (BID) | ORAL | Status: DC
  Administered 2018-06-07 – 2018-06-12 (×10): 100 mg via ORAL
  Filled 2018-06-07 (×10): qty 1

## 2018-06-07 MED ORDER — VALACYCLOVIR HCL 500 MG PO TABS
1000.0000 mg | ORAL_TABLET | Freq: Three times a day (TID) | ORAL | Status: DC
  Administered 2018-06-07 – 2018-06-12 (×15): 1000 mg via ORAL
  Filled 2018-06-07 (×15): qty 2

## 2018-06-07 MED ORDER — ONDANSETRON HCL 4 MG PO TABS: 4.0000 mg | ORAL_TABLET | Freq: Four times a day (QID) | ORAL | Status: DC | PRN

## 2018-06-07 MED ORDER — ACETAMINOPHEN 325 MG PO TABS: 650.0000 mg | ORAL_TABLET | Freq: Four times a day (QID) | ORAL | Status: DC | PRN

## 2018-06-07 MED ORDER — HYDROCODONE-ACETAMINOPHEN 5-325 MG PO TABS
1.0000 | ORAL_TABLET | ORAL | Status: DC | PRN
  Administered 2018-06-08 – 2018-06-12 (×13): 2 via ORAL
  Filled 2018-06-07 (×14): qty 2

## 2018-06-07 MED ORDER — GABAPENTIN 300 MG PO CAPS
300.0000 mg | ORAL_CAPSULE | Freq: Four times a day (QID) | ORAL | Status: DC
  Administered 2018-06-07 – 2018-06-12 (×19): 300 mg via ORAL
  Filled 2018-06-07 (×19): qty 1

## 2018-06-07 MED ORDER — DIPHENHYDRAMINE HCL 25 MG PO CAPS
25.0000 mg | ORAL_CAPSULE | Freq: Three times a day (TID) | ORAL | Status: DC | PRN
  Administered 2018-06-07: 25 mg via ORAL
  Filled 2018-06-07: qty 1

## 2018-06-07 MED ORDER — SODIUM CHLORIDE 0.9 % IV SOLN: 250.0000 mL | INTRAVENOUS | Status: DC | PRN

## 2018-06-07 MED ORDER — BACLOFEN 10 MG PO TABS
10.0000 mg | ORAL_TABLET | Freq: Three times a day (TID) | ORAL | Status: DC
  Administered 2018-06-07 – 2018-06-12 (×15): 10 mg via ORAL
  Filled 2018-06-07 (×15): qty 1

## 2018-06-07 MED ORDER — ONDANSETRON HCL 4 MG/2ML IJ SOLN: 4.0000 mg | Freq: Four times a day (QID) | INTRAMUSCULAR | Status: DC | PRN

## 2018-06-07 MED ORDER — SODIUM CHLORIDE 0.9% FLUSH
3.0000 mL | Freq: Two times a day (BID) | INTRAVENOUS | Status: DC
  Administered 2018-06-07 – 2018-06-12 (×9): 3 mL via INTRAVENOUS

## 2018-06-07 MED ORDER — TRAZODONE HCL 50 MG PO TABS
50.0000 mg | ORAL_TABLET | Freq: Every evening | ORAL | Status: DC | PRN
  Filled 2018-06-07: qty 1

## 2018-06-07 MED ORDER — OXYCODONE HCL 5 MG PO TABS
15.0000 mg | ORAL_TABLET | Freq: Three times a day (TID) | ORAL | Status: DC
  Administered 2018-06-07 – 2018-06-08 (×5): 15 mg via ORAL
  Filled 2018-06-07 (×5): qty 3

## 2018-06-07 MED ORDER — CLINDAMYCIN PHOSPHATE 600 MG/50ML IV SOLN
600.0000 mg | Freq: Three times a day (TID) | INTRAVENOUS | Status: DC
  Administered 2018-06-07 – 2018-06-12 (×14): 600 mg via INTRAVENOUS
  Filled 2018-06-07 (×14): qty 50

## 2018-06-07 MED ORDER — NABUMETONE 500 MG PO TABS
500.0000 mg | ORAL_TABLET | Freq: Two times a day (BID) | ORAL | Status: DC
  Administered 2018-06-07 – 2018-06-12 (×10): 500 mg via ORAL
  Filled 2018-06-07 (×17): qty 1

## 2018-06-07 MED ORDER — ACETAMINOPHEN 650 MG RE SUPP: 650.0000 mg | Freq: Four times a day (QID) | RECTAL | Status: DC | PRN

## 2018-06-07 MED ORDER — ENOXAPARIN SODIUM 100 MG/ML ~~LOC~~ SOLN
100.0000 mg | SUBCUTANEOUS | Status: DC
  Administered 2018-06-07 – 2018-06-11 (×5): 100 mg via SUBCUTANEOUS
  Filled 2018-06-07 (×5): qty 1

## 2018-06-07 MED ORDER — CLINDAMYCIN PHOSPHATE 600 MG/50ML IV SOLN
600.0000 mg | Freq: Once | INTRAVENOUS | Status: AC
  Administered 2018-06-07: 600 mg via INTRAVENOUS
  Filled 2018-06-07: qty 50

## 2018-06-07 NOTE — H&P (Signed)
History and Physical  Albert GoHugh T Mahl WUJ:[id number] DOB: 15-Jun-1966 DOA: 06/07/2018  Referring physician: Clarene DukeMcManus  PCP: Oval Linseyondiego, Richard, MD   Chief Complaint: left leg infection   HPI: Albert Klein is a 52 y.o. morbidly obese male with a PMH of polysubstance abuse, recurrent lower extremity cellulitis, asthma and other history detailed below presented to ED with worsening redness, swelling, weeping and tenderness of the left leg that is very similar to other presentations that he has had for cellulitis requiring a prolonged course of IV antibiotics for treatment.  He says that the leg has severe itching and burning.  He says that it started with painful fluid filled blisters that burst.  It is associated with a feeling of burning, pain and fire on leg especially back of calf where it initially started.  He denies fever and chills.  He says that symptoms have progressed since he first began to notice them yesterday.  He denies nausea and emesis.  He has no shortness of breath or chest pain.  He was started on IV clindamycin and admission was requested for further treatment of cellulitis of left leg.  He says that he has chronic pain and is currently going to a pain management clinic.    Review of Systems: All systems reviewed and apart from history of presenting illness, are negative.  Past Medical History:  Diagnosis Date  . Arthritis   . Asthma   . Back pain   . Cellulitis of left leg   . DJD (degenerative joint disease)   . Homeless   . Kidney stones   . Obesity   . Polysubstance abuse (HCC)    History reviewed. No pertinent surgical history. Social History:  reports that he has never smoked. He has never used smokeless tobacco. He reports that he has current or past drug history. Drug: Marijuana. He reports that he does not drink alcohol.  No Known Allergies  Family History  Problem Relation Age of Onset  . Cancer Mother   . Emphysema Mother   . Diabetes type II Mother   .  Heart failure Mother   . Cancer Father   . Diabetes type II Father     Prior to Admission medications   Medication Sig Start Date End Date Taking? Authorizing Provider  albuterol (PROVENTIL HFA;VENTOLIN HFA) 108 (90 BASE) MCG/ACT inhaler Inhale 2 puffs into the lungs every 6 (six) hours as needed for wheezing or shortness of breath.    [provider]  nabumetone (RELAFEN) 500 MG tablet Take 500 mg by mouth 2 (two) times daily.    [provider]  oxyCODONE (ROXICODONE) 15 MG immediate release tablet Take 15 mg by mouth 4 (four) times daily - after meals and at bedtime.    [provider]  pregabalin (LYRICA) 75 MG capsule Take 75 mg by mouth 2 (two) times daily.    [provider]   Physical Exam: Vitals:   06/07/18 0909 06/07/18 0911 06/07/18 0930  BP:  135/75 122/70  Pulse:  73 68  Resp:  18   Temp:  97.9 F (36.6 C)   TempSrc:  Oral   SpO2:  98% 97%  Weight: (!) 191.9 kg (423 lb)    Height: 6\' 1"  (1.854 m)       General exam: morbidly obese male, lying comfortably supine on the gurney in no obvious distress.  He is cooperative.   Head, eyes and ENT: Nontraumatic and normocephalic. Pupils equally reacting to light  and accommodation. Oral mucosa moist.  Neck: Supple. No JVD, carotid bruit or thyromegaly.  Lymphatics: No lymphadenopathy.  Respiratory system: Clear to auscultation. No increased work of breathing.  Cardiovascular system: S1 and S2 heard, RRR. No JVD, murmurs, gallops, clicks or pedal edema.  Gastrointestinal system: Abdomen is morbidly obese, soft and nontender. Normal bowel sounds heard. No organomegaly or masses appreciated.  Central nervous system: Alert and oriented. No focal neurological deficits.  Extremities: Symmetric 5 x 5 power. Peripheral pulses symmetrically felt.  Left leg with open blisters and crusted lesions, surrounded by erythema, heat and very tender to palpation, some weeping from the open blister  lesions noted.  Lesions appear herpetic.                   Skin: Herpetic lesions on leg as noted above.   Musculoskeletal system: Negative exam.  Psychiatry: Pleasant and cooperative.  Labs on Admission:  Basic Metabolic Panel: Recent Labs  Lab 06/07/18 1000  NA 137  K 4.2  CL 104  CO2 26  GLUCOSE 98  BUN 18  CREATININE 1.03  CALCIUM 8.8*   Liver Function Tests: No results for input(s): AST, ALT, ALKPHOS, BILITOT, PROT, ALBUMIN in the last 168 hours. No results for input(s): LIPASE, AMYLASE in the last 168 hours. No results for input(s): AMMONIA in the last 168 hours. CBC: Recent Labs  Lab 06/07/18 1000  WBC 5.3  NEUTROABS 3.2  HGB 12.0*  HCT 38.1*  MCV 91.6  PLT 209   Cardiac Enzymes: No results for input(s): CKTOTAL, CKMB, CKMBINDEX, TROPONINI in the last 168 hours.  BNP (last 3 results) No results for input(s): PROBNP in the last 8760 hours. CBG: No results for input(s): GLUCAP in the last 168 hours.  Radiological Exams on Admission: No results found.  Assessment/Plan Principal Problem:   Cellulitis of left leg Active Problems:   Morbid obesity (HCC)   Asthma   GERD (gastroesophageal reflux disease)   Polysubstance abuse (HCC)   Anemia   Shingles outbreak   Shingles rash  1. Shingles outbreak left leg - Pt has painful blisters and ulcers that appear herpetic associated with surrounding erythema and changes consistent with cellulitis.  Treating patient with oral valtrex and also treating cellulitis as noted before.  Checking with infection control about whether the patient will require airborne precautions.  Contact precautions started.   2. Cellulitis of left leg - moderate disease, continue IV clindamycin (Pt is allergic to penicillin).   3. Chronic pain/opioid dependence - resume home pain medication regimen.  4. GERD - protonix for GI protection.   5. Anemia - unspecified, recheck CBC in AM.  6. Asthma - stable. 7. Morbid obesity  - nightly CPAP ordered.  Bariatric bed.   DVT Prophylaxis: lovenox  Code Status: Full   Family Communication: patient   Disposition Plan: Med surgical bed   Time spent: 61 mins  Standley Dakins, MD [facility] Pager [phone]  If 7PM-7AM, please contact night-coverage [email or web address] Password [email or web address] 06/07/2018, 11:55 AM

## 2018-06-07 NOTE — ED Notes (Signed)
Pt provided with lunch tray.  Moved to bariatric bed for comfort.

## 2018-06-07 NOTE — ED Provider Notes (Signed)
[facility] EMERGENCY DEPARTMENT Provider Note   CSN: [id number] Arrival date & time: 06/07/18  0857     History   Chief Complaint Chief Complaint  Patient presents with  . Recurrent Skin Infections    HPI Albert Klein is a 52 y.o. male.  HPI Pt was seen at 0935. Per pt, c/o gradual onset and worsening of persistent left lower leg "redness" that began yesterday. Pt states his leg "was a little pink" and now is "getting worse." States he woke up today with his left lower leg "red" and "weaping." Has been associated with subjective home fevers. Pt states "when I get like this I have to be admitted for IV antibiotics."  Denies focal motor weakness, no tingling/numbness in extremities, no injury.    Past Medical History:  Diagnosis Date  . Arthritis   . Asthma   . Back pain   . Cellulitis of left leg   . DJD (degenerative joint disease)   . Homeless   . Kidney stones   . Obesity   . Polysubstance abuse [facility])     Patient Active Problem List   Diagnosis Date Noted  . Right hip pain 11/09/2013  . Folic acid deficiency 06/27/2011  . Iron deficiency anemia 06/27/2011  . Abnormal thyroid function test 06/27/2011  . Thrombocytopenia (HCC) 06/26/2011  . Anemia 06/26/2011  . Cellulitis and abscess of leg 06/25/2011  . ARF (acute renal failure) (HCC) 06/25/2011  . Morbid obesity (HCC) 06/25/2011  . Asthma 06/25/2011  . GERD (gastroesophageal reflux disease) 06/25/2011  . Back pain 06/25/2011  . Polysubstance abuse (HCC) 06/25/2011  . Hypokalemia 06/25/2011    History reviewed. No pertinent surgical history.      Home Medications    Prior to Admission medications   Medication Sig Start Date End Date Taking? Authorizing Provider  albuterol (PROVENTIL HFA;VENTOLIN HFA) 108 (90 BASE) MCG/ACT inhaler Inhale 2 puffs into the lungs every 6 (six) hours as needed for wheezing or shortness of breath.    [provider]  nabumetone (RELAFEN) 500 MG tablet Take 500 mg  by mouth 2 (two) times daily.    [provider]  oxyCODONE (ROXICODONE) 15 MG immediate release tablet Take 15 mg by mouth 4 (four) times daily - after meals and at bedtime.    [provider]  pregabalin (LYRICA) 75 MG capsule Take 75 mg by mouth 2 (two) times daily.    [provider]    Family History Family History  Problem Relation Age of Onset  . Cancer Mother   . Emphysema Mother   . Diabetes type II Mother   . Heart failure Mother   . Cancer Father   . Diabetes type II Father     Social History Social History   Tobacco Use  . Smoking status: Never Smoker  . Smokeless tobacco: Never Used  Substance Use Topics  . Alcohol use: No  . Drug use: Not Currently    Types: Marijuana    Comment: former     Allergies   Aspirin and Penicillins   Review of Systems Review of Systems ROS: Statement: All systems negative except as marked or noted in the HPI; Constitutional: Negative for fever and chills. ; ; Eyes: Negative for eye pain, redness and discharge. ; ; ENMT: Negative for ear pain, hoarseness, nasal congestion, sinus pressure and sore throat. ; ; Cardiovascular: Negative for chest pain, palpitations, diaphoresis, dyspnea and peripheral edema. ; ; Respiratory: Negative for cough, wheezing and stridor. ; ;  Gastrointestinal: Negative for nausea, vomiting, diarrhea, abdominal pain, blood in stool, hematemesis, jaundice and rectal bleeding. . ; ; Genitourinary: Negative for dysuria, flank pain and hematuria. ; ; Musculoskeletal: Negative for back pain and neck pain. Negative for deformity and trauma.; ; Skin: +rash. Negative for pruritus, abrasions, bruising and skin lesion.; ; Neuro: Negative for headache, lightheadedness and neck stiffness. Negative for weakness, altered level of consciousness, altered mental status, extremity weakness, paresthesias, involuntary movement, seizure and syncope.       Physical Exam Updated Vital Signs BP 135/75 (BP  Location: Right Arm)   Pulse 73   Temp 97.9 F (36.6 C) (Oral)   Resp 18   Ht 6\' 1"  (1.854 m)   Wt (!) 191.9 kg (423 lb)   SpO2 98%   BMI 55.81 kg/m   Physical Exam 0940: Physical examination:  Nursing notes reviewed; Vital signs and O2 SAT reviewed;  Constitutional: Well developed, Well nourished, Well hydrated, In no acute distress; Head:  Normocephalic, atraumatic; Eyes: EOMI, PERRL, No scleral icterus; ENMT: Mouth and pharynx normal, Mucous membranes moist; Neck: Supple, Full range of motion, No lymphadenopathy; Cardiovascular: Regular rate and rhythm, No gallop; Respiratory: Breath sounds clear & equal bilaterally, No wheezes.  Speaking full sentences with ease, Normal respiratory effort/excursion; Chest: Nontender, Movement normal; Abdomen: Soft, morbidly obese. Nontender, Normal bowel sounds; Genitourinary: No CVA tenderness; Extremities: Peripheral pulses normal, +left lower leg with circumferential erythema from ankle to knee, mid-tibial anterior and lateral areas have darker erythema with weeping. No ecchymosis, no blisters.  No deformity. +2 pedal edema bilat. No calf asymmetry.; Neuro: AA&Ox3, Major CN grossly intact.  Speech clear. No gross focal motor or sensory deficits in extremities.; Skin: Color normal, Warm, Dry.   ED Treatments / Results  Labs (all labs ordered are listed, but only abnormal results are displayed)   EKG None  Radiology   Procedures Procedures (including critical care time)  Medications Ordered in ED Medications  oxyCODONE-acetaminophen (PERCOCET/ROXICET) 5-325 MG per tablet 1 tablet (has no administration in time range)     Initial Impression / Assessment and Plan / ED Course  I have reviewed the triage vital signs and the nursing notes.  Pertinent labs & imaging results that were available during my care of the patient were reviewed by me and considered in my medical decision making (see chart for details).  MDM Reviewed: previous chart,  nursing note and vitals Reviewed previous: labs and ultrasound Interpretation: labs   Results for orders placed or performed during the hospital encounter of 06/07/18  Basic metabolic panel  Result Value Ref Range   Sodium 137 135 - 145 mmol/L   Potassium 4.2 3.5 - 5.1 mmol/L   Chloride 104 98 - 111 mmol/L   CO2 26 22 - 32 mmol/L   Glucose, Bld 98 70 - 99 mg/dL   BUN 18 6 - 20 mg/dL   Creatinine, Ser 4.851.03 0.61 - 1.24 mg/dL   Calcium 8.8 (L) 8.9 - 10.3 mg/dL   GFR calc non Af Amer >60 >60 mL/min   GFR calc Af Amer >60 >60 mL/min   Anion gap 7 5 - 15  Lactic acid, plasma  Result Value Ref Range   Lactic Acid, Venous 1.5 0.5 - 1.9 mmol/L  CBC with Differential  Result Value Ref Range   WBC 5.3 4.0 - 10.5 K/uL   RBC 4.16 (L) 4.22 - 5.81 MIL/uL   Hemoglobin 12.0 (L) 13.0 - 17.0 g/dL   HCT 46.238.1 (L) 70.339.0 - 50.052.0 %  MCV 91.6 78.0 - 100.0 fL   MCH 28.8 26.0 - 34.0 pg   MCHC 31.5 30.0 - 36.0 g/dL   RDW 09.8 11.9 - 14.7 %   Platelets 209 150 - 400 K/uL   Neutrophils Relative % 61 %   Neutro Abs 3.2 1.7 - 7.7 K/uL   Lymphocytes Relative 22 %   Lymphs Abs 1.2 0.7 - 4.0 K/uL   Monocytes Relative 10 %   Monocytes Absolute 0.5 0.1 - 1.0 K/uL   Eosinophils Relative 7 %   Eosinophils Absolute 0.4 0.0 - 0.7 K/uL   Basophils Relative 0 %   Basophils Absolute 0.0 0.0 - 0.1 K/uL    1100:  Long hx recurrent cellulitis LLE. Current cellulitis worsening over 1 day; will admit. T/C returned from [facility]. Laural Benes, case discussed, including:  HPI, pertinent PM/SHx, VS/PE, dx testing, ED course and treatment:  Agreeable to admit.    Final Clinical Impressions(s) / ED Diagnoses   Final diagnoses:  None    ED Discharge Orders    None       Samuel Jester, DO 06/09/18 1559

## 2018-06-07 NOTE — ED Triage Notes (Signed)
Patient c/o weeping and redness to left leg. Per patient started yesterday and is progressively getting worse. Patient reports recurrent cellulitis in which he has had to be hospitalized. Unsure of any fevers.

## 2018-06-08 LAB — RAPID URINE DRUG SCREEN, HOSP PERFORMED
Amphetamines: NOT DETECTED
BENZODIAZEPINES: NOT DETECTED
Barbiturates: NOT DETECTED
COCAINE: NOT DETECTED
Opiates: POSITIVE — AB
Tetrahydrocannabinol: POSITIVE — AB

## 2018-06-08 LAB — CBC WITH DIFFERENTIAL/PLATELET
BASOS ABS: 0 10*3/uL (ref 0.0–0.1)
Basophils Relative: 0 %
EOS PCT: 7 %
Eosinophils Absolute: 0.3 10*3/uL (ref 0.0–0.7)
HCT: 33.1 % — ABNORMAL LOW (ref 39.0–52.0)
Hemoglobin: 10.5 g/dL — ABNORMAL LOW (ref 13.0–17.0)
Lymphocytes Relative: 33 %
Lymphs Abs: 1.3 10*3/uL (ref 0.7–4.0)
MCH: 28.8 pg (ref 26.0–34.0)
MCHC: 31.7 g/dL (ref 30.0–36.0)
MCV: 90.9 fL (ref 78.0–100.0)
MONO ABS: 0.3 10*3/uL (ref 0.1–1.0)
Monocytes Relative: 9 %
Neutro Abs: 2 10*3/uL (ref 1.7–7.7)
Neutrophils Relative %: 51 %
PLATELETS: 168 10*3/uL (ref 150–400)
RBC: 3.64 MIL/uL — ABNORMAL LOW (ref 4.22–5.81)
RDW: 13.7 % (ref 11.5–15.5)
WBC: 4 10*3/uL (ref 4.0–10.5)

## 2018-06-08 LAB — COMPREHENSIVE METABOLIC PANEL
ALK PHOS: 53 U/L (ref 38–126)
ALT: 9 U/L (ref 0–44)
AST: 18 U/L (ref 15–41)
Albumin: 3 g/dL — ABNORMAL LOW (ref 3.5–5.0)
Anion gap: 6 (ref 5–15)
BUN: 19 mg/dL (ref 6–20)
CALCIUM: 8.5 mg/dL — AB (ref 8.9–10.3)
CO2: 26 mmol/L (ref 22–32)
Chloride: 105 mmol/L (ref 98–111)
Creatinine, Ser: 1.07 mg/dL (ref 0.61–1.24)
GFR calc non Af Amer: >60 mL/min (ref >60)
Glucose, Bld: 130 mg/dL — ABNORMAL HIGH (ref 70–99)
Potassium: 3.9 mmol/L (ref 3.5–5.1)
SODIUM: 137 mmol/L (ref 135–145)
TOTAL PROTEIN: 5.8 g/dL — AB (ref 6.5–8.1)
Total Bilirubin: 0.7 mg/dL (ref 0.3–1.2)

## 2018-06-08 LAB — MAGNESIUM: MAGNESIUM: 1.9 mg/dL (ref 1.7–2.4)

## 2018-06-08 MED ORDER — OXYCODONE HCL 5 MG PO TABS
15.0000 mg | ORAL_TABLET | Freq: Four times a day (QID) | ORAL | Status: DC | PRN
  Administered 2018-06-08 – 2018-06-12 (×16): 15 mg via ORAL
  Filled 2018-06-08 (×16): qty 3

## 2018-06-08 MED ORDER — SACCHAROMYCES BOULARDII 250 MG PO CAPS
250.0000 mg | ORAL_CAPSULE | Freq: Two times a day (BID) | ORAL | Status: DC
Start: 2018-06-08 — End: 2018-06-12
  Administered 2018-06-08 – 2018-06-12 (×9): 250 mg via ORAL
  Filled 2018-06-08 (×9): qty 1

## 2018-06-08 NOTE — Progress Notes (Signed)
Xeroform, abd and kerlix applied to left calf cellulitis.  Scant amount of serous drainage.

## 2018-06-08 NOTE — Progress Notes (Signed)
Initial Nutrition Assessment  DOCUMENTATION CODES:   Morbid obesity  INTERVENTION:  Calorie count ordered by MD- will start today at lunch through Wednesday lunch.    NUTRITION DIAGNOSIS:  (Morbid obesity) related to social / environmental circumstances(long-term poor eating habits and dependace on others for transportation and food ) as evidenced by per patient/family report.  GOAL:  (Patient will follow regular 3 meal eating pattern  and limit snacking between meals)  MONITOR:  PO intake, Skin, Labs   REASON FOR ASSESSMENT:  Consult Assessment of nutrition requirement/status, Wound healing, Calorie Count    ASSESSMENT:  Patient is a morbidly obese 52 yo male who presents with cellulitis (shingles) left leg. History of polysubstance abuse, arthritis, GERD, anemia and chronic pain. Assessed by WOC.   Patient weight history has ranged from 424-484 lb the past 7 years. Nursing reports he is continually asking for food, snacks and beverages. Patient receives food stamps and lives with someone who also has food stamps. He reports usually has one meal daily in the evening throughout the month. Patient says a friend brought in a gravy biscuit and tator tots this morning and he ate a hamburger and fries for lunch today.  Patient is aware of community food resources and has used [facility] in the past. Reports limited transportation which is a factor for obtaining food.   Calorie count ordered by MD- will start today at lunch through Wednesday lunch. RD has talked with nurse and nutrition services. Patient is asking why the MD ordered a calorie count.  Weight loss is desirable for the patient -although question whether he is ready to make the necessary lifestyle changes this undertaking would require given his financial limitations and the magnitude of commitment it would require.    Labs: BMP Latest Ref Rng & Units 06/08/2018 06/07/2018 11/09/2013  Glucose 70 - 99  mg/dL 811(B130(H) 98 92  BUN 6 - 20 mg/dL 19 18 18   Creatinine 0.61 - 1.24 mg/dL 1.471.07 8.291.03 5.621.20  Sodium 135 - 145 mmol/L 137 137 138  Potassium 3.5 - 5.1 mmol/L 3.9 4.2 4.4  Chloride 98 - 111 mmol/L 105 104 100  CO2 22 - 32 mmol/L 26 26 27   Calcium 8.9 - 10.3 mg/dL 1.3(Y8.5(L) 8.6(V8.8(L) 9.8    Medications reviewed and include: colace, florastor, valtrex, relafen    Diet Order:   Diet Order           Diet regular Room service appropriate? Yes; Fluid consistency: Thin  Diet effective now          EDUCATION NEEDS:  Education needs have been addressed   Skin:  Skin Assessment: Skin Integrity Issues:erythremia, fluid filled blisters per WOC assessment- treated with Valtrex and clindamycin   Last BM:  06/07/18  Height:   Ht Readings from Last 1 Encounters:  06/07/18 6\' 1"  (1.854 m)    Weight:   Wt Readings from Last 1 Encounters:  06/07/18 (!) 483 lb 12.8 oz (219.5 kg)    Ideal Body Weight:  84 kg  BMI:  Body mass index is 63.83 kg/m.  Estimated Nutritional Needs:   Kcal:  [phone] (25-28 kcal/IBW)   Protein:  140-150 gr   Fluid:  2.2-2.4 liters daily   Royann ShiversLynn Terral Cooks MS,RD,CSG,LDN Office: [phone] Pager: [phone]

## 2018-06-08 NOTE — Plan of Care (Signed)
Pt c/o chronic back pain. Rating of 8 on 0-10 pain scale. Will continue to monitor.

## 2018-06-08 NOTE — Progress Notes (Signed)
Patient with morbid obesity herpetic type lesions and surrounding erythema consistent with cellulitis being treated with Valtrex orally as well as clindamycin being seen by wound care nurse currently on contact precautions patient likewise seen at pain clinic in addition treated with Lyrica as well as Relafen by myself as an Collene Maresoutpatient*Chananya T Shipes WJX:[id number] DOB: Jun 07, 1966 DOA: 06/07/2018 PCP: Oval Linseyondiego, Ramzey Petrovic, MD   Physical Exam: Blood pressure 115/63, pulse 64, temperature 97.7 F (36.5 C), temperature source Oral, resp. rate 16, height 6\' 1"  (1.854 m), weight (!) 219.5 kg (483 lb 12.8 oz), SpO2 99 %.  Lungs no rales wheeze rhonchi heart regular rhythm no S3-S4 no heaves thrills rubs wound currently being addressed by wound care nurse   Investigations:  No results found for this or any previous visit (from the past 240 hour(s)).   Basic Metabolic Panel: Recent Labs    06/07/18 1000 06/08/18 0634  NA 137 137  K 4.2 3.9  CL 104 105  CO2 26 26  GLUCOSE 98 130*  BUN 18 19  CREATININE 1.03 1.07  CALCIUM 8.8* 8.5*  MG  --  1.9   Liver Function Tests: Recent Labs    06/08/18 0634  AST 18  ALT 9  ALKPHOS 53  BILITOT 0.7  PROT 5.8*  ALBUMIN 3.0*     CBC: Recent Labs    06/07/18 1000 06/08/18 0634  WBC 5.3 4.0  NEUTROABS 3.2 2.0  HGB 12.0* 10.5*  HCT 38.1* 33.1*  MCV 91.6 90.9  PLT 209 168    No results found.    Medications:   Impression:  Principal Problem:   Cellulitis of left leg Active Problems:   Morbid obesity (HCC)   Asthma   GERD (gastroesophageal reflux disease)   Polysubstance abuse (HCC)   Anemia   Shingles outbreak   Shingles rash     Continue opioid analgesia as prescribed.  Continue Relafen Lyrica.  Continue Valtrex for anti-herpetic therapy as well as clindamycin for skin cellulitis  Consultants: Wound care team    Procedures   Antibiotics: Clindamycin and Valtrex         Time spent: 30 minutes   LOS: 1 day    Sirenia Whitis M   06/08/2018, 1:26 PM

## 2018-06-08 NOTE — Consult Note (Addendum)
WOC Nurse wound consult note Reason for Consult: Consult requested for left leg.  Reviewed progress notes and photos in the EMR. Wound type: Left leg with generalized edema and erythremia related to cellulitis.  Patchy areas of red, moist, partial thickness skin loss where previous yellow fluid filled blisters have ruptured, and scattered areas of red raised vesicles;  EMR states these are related to possible shingles.  Topical treatment is not effective for this medical condition and healing will be from systemic medication coverage. Dressing procedure/placement/frequency: Xerororm gauze to promote drying and healing, ABD pad and kerlex to absorb drainage. Dressing change orders provided for staff nurses to perform. Please re-consult if further assistance is needed.  Thank-you,  Cammie Mcgeeawn Trivia Heffelfinger MSN, RN, CWOCN, DumontWCN-AP, CNS [phone]

## 2018-06-08 NOTE — Progress Notes (Signed)
I spoke with patient about wearing a CPAP tonight and having any past history use. He was negative to using a machine or having a diagnosis of needing one from a  Physician. He explained he had a drug history but nothing that would effect his breathing while asleep that he was aware of. He noted he had not had any sleep studies either. The only thing he was aware of was that he was overweight and the issues with his legs at the present time. I explained what was the purpose of the machine and what he would be wearing to best fit him. I attempted to place the mask on his face and patient was very uncomfortable and couldn't breathe while wearing the mask. After several attempts, I removed unit from patient room. Patient will contiune to be monitored and saturations checked while he sleeps.

## 2018-06-09 LAB — HIV ANTIBODY (ROUTINE TESTING W REFLEX): HIV SCREEN 4TH GENERATION: NONREACTIVE

## 2018-06-09 NOTE — Progress Notes (Signed)
Dressing changed to left leg.  Scant amount of bloody drainage from posterior calf.

## 2018-06-09 NOTE — Progress Notes (Signed)
Patient continues on Valtrex as well as clindamycin leg has ceased to spread to any herpetic lesions patient on contact precautions Arville GoHugh T Clendenning [id number] DOB: 10/22/1966 DOA: 06/07/2018 PCP: Oval Linseyondiego, Bettie Capistran, MD   Physical Exam: Blood pressure (!) 123/110, pulse 74, temperature 97.9 F (36.6 C), temperature source Oral, resp. rate 20, height 6\' 1"  (1.854 m), weight (!) 219.5 kg (483 lb 12.8 oz), SpO2 98 %.  Lungs clear to A&P no rales wheeze rhonchi heart regular rhythm no murmurs gallops heaves thrills rubs abdomen soft nontender bowel sounds normoactive   Investigations:  No results found for this or any previous visit (from the past 240 hour(s)).   Basic Metabolic Panel: Recent Labs    06/07/18 1000 06/08/18 0634  NA 137 137  K 4.2 3.9  CL 104 105  CO2 26 26  GLUCOSE 98 130*  BUN 18 19  CREATININE 1.03 1.07  CALCIUM 8.8* 8.5*  MG  --  1.9   Liver Function Tests: Recent Labs    06/08/18 0634  AST 18  ALT 9  ALKPHOS 53  BILITOT 0.7  PROT 5.8*  ALBUMIN 3.0*     CBC: Recent Labs    06/07/18 1000 06/08/18 0634  WBC 5.3 4.0  NEUTROABS 3.2 2.0  HGB 12.0* 10.5*  HCT 38.1* 33.1*  MCV 91.6 90.9  PLT 209 168    No results found.    Medications:  Impression:  Principal Problem:   Cellulitis of left leg Active Problems:   Morbid obesity (HCC)   Asthma   GERD (gastroesophageal reflux disease)   Polysubstance abuse (HCC)   Anemia   Shingles outbreak   Shingles rash     Plan: CBC in a.m. presumed dilutional drop in hemoglobin hematocrit.  Continue Valtrex continue clindamycin continue contact respiratory precautions  Consultants: Wound care team   Procedures   Antibiotics: Clindamycin also on Valtrex           Time spent: 30 minutes   LOS: 2 days   Davianna Deutschman M   06/09/2018, 12:43 PM

## 2018-06-10 NOTE — Progress Notes (Signed)
Patient currently on Valtrex for herpetic lesions and clindamycin for bacterial superinfection out of bed to bathroom we will get PT consult today for ambulation and strengthening in anticipation of discharge in 48 hours Albert Klein [id number] DOB: 1966-03-19 DOA: 06/07/2018 PCP: Oval Linseyondiego, Yomira Flitton, MD   Physical Exam: Blood pressure 119/70, pulse 66, temperature 98.6 F (37 C), temperature source Oral, resp. rate 20, height 6\' 1"  (1.854 m), weight (!) 219.5 kg (483 lb 12.8 oz), SpO2 99 %.  Lungs clear to A&P no rales rhonchi heart regular rhythm no S3-S4 no heaves thrills rubs abdomen soft nontender no detectable organomegaly  Investigations:  No results found for this or any previous visit (from the past 240 hour(s)).   Basic Metabolic Panel: Recent Labs    06/08/18 0634  NA 137  K 3.9  CL 105  CO2 26  GLUCOSE 130*  BUN 19  CREATININE 1.07  CALCIUM 8.5*  MG 1.9   Liver Function Tests: Recent Labs    06/08/18 0634  AST 18  ALT 9  ALKPHOS 53  BILITOT 0.7  PROT 5.8*  ALBUMIN 3.0*     CBC: Recent Labs    06/08/18 0634  WBC 4.0  NEUTROABS 2.0  HGB 10.5*  HCT 33.1*  MCV 90.9  PLT 168    No results found.    Medications:   Impression:  Principal Problem:   Cellulitis of left leg Active Problems:   Morbid obesity (HCC)   Asthma   GERD (gastroesophageal reflux disease)   Polysubstance abuse (HCC)   Anemia   Shingles outbreak   Shingles rash     Plan: Continue Valtrex continue clindamycin begin physical therapy for strengthening and ambulation Consultants: Wound care team  Procedures debridement of leg wound   Antibiotics: Clindamycin and antiviral agent of Valtrex           Time spent: 30 minutes  LOS: 3 days   Demetra Moya M   06/10/2018, 12:10 PM

## 2018-06-11 LAB — CBC WITH DIFFERENTIAL/PLATELET
Basophils Absolute: 0 10*3/uL (ref 0.0–0.1)
Basophils Relative: 1 %
EOS ABS: 0.5 10*3/uL (ref 0.0–0.7)
Eosinophils Relative: 9 %
HCT: 34.4 % — ABNORMAL LOW (ref 39.0–52.0)
HEMOGLOBIN: 10.7 g/dL — AB (ref 13.0–17.0)
LYMPHS PCT: 32 %
Lymphs Abs: 1.7 10*3/uL (ref 0.7–4.0)
MCH: 28.3 pg (ref 26.0–34.0)
MCHC: 31.1 g/dL (ref 30.0–36.0)
MCV: 91 fL (ref 78.0–100.0)
MONOS PCT: 10 %
Monocytes Absolute: 0.5 10*3/uL (ref 0.1–1.0)
NEUTROS PCT: 48 %
Neutro Abs: 2.6 10*3/uL (ref 1.7–7.7)
Platelets: 210 10*3/uL (ref 150–400)
RBC: 3.78 MIL/uL — AB (ref 4.22–5.81)
RDW: 13.7 % (ref 11.5–15.5)
WBC: 5.2 10*3/uL (ref 4.0–10.5)

## 2018-06-11 NOTE — Plan of Care (Signed)
  Problem: Acute Rehab PT Goals(only PT should resolve) Goal: Pt Will Go Supine/Side To Sit Outcome: Progressing Flowsheets (Taken 06/11/2018 1535) Pt will go Supine/Side to Sit: with modified independence Goal: Patient Will Transfer Sit To/From Stand Outcome: Progressing Flowsheets (Taken 06/11/2018 1535) Patient will transfer sit to/from stand: with modified independence Goal: Pt Will Transfer Bed To Chair/Chair To Bed Outcome: Progressing Flowsheets (Taken 06/11/2018 1535) Pt will Transfer Bed to Chair/Chair to Bed: with modified independence Goal: Pt Will Ambulate Outcome: Progressing Flowsheets (Taken 06/11/2018 1535) Pt will Ambulate: 75 feet; with modified independence; with cane Note:  Quad-cane (wide based)    3:36 PM, 06/11/18 Ocie BobJames Khamari Yousuf, MPT Physical Therapist [phone] office [phone] mobile phone

## 2018-06-11 NOTE — Care Management Note (Addendum)
Case Management Note  Patient Details  Name: Albert Klein T Furlan MRN: [id number] Date of Birth: 01-15-1966       Admitted with cellulitis. [facility] from [facility], lives with "friend". Pt reports being ind with ADL's. [facility]. Pt declines HH services at this time. Does not feel his roommate would like this and feels his roommates dogs may try to harm [facility]. Pt says he has a friend who wants to work as his aid and was told we could help with that. CM explained that needs to be initiated by PCP office. Pt plans to call office to make request.  CSW has been consulted for transportation needs.               Expected Discharge Date:    06/11/18              Expected Discharge Plan:  Home/Self Care  In-House Referral:  NA  Discharge planning Services  CM Consult  Post Acute Care Choice:  NA Choice offered to:  Patient  Status of Service:  Completed, signed off   Addendum: PT recommended quad cane. [facility]. DME will be delivered to pt room prior to DC.   Malcolm Metrohildress, Cayle Cordoba Demske, RN 06/11/2018, 1:52 PM

## 2018-06-11 NOTE — Progress Notes (Signed)
Patient continues on clindamycin and Valtrex for herpetic lesions as well as superimposed cellulitis presumably bacterial physical therapy ordered for today in anticipation of discharge tomorrow with home health and physical therapy Arville GoHugh T Glennon [id number] DOB: 03-28-1966 DOA: 06/07/2018 PCP: Oval Linseyondiego, Shlonda Dolloff, MD   Physical Exam: Blood pressure (!) 101/57, pulse 63, temperature 98.3 F (36.8 C), temperature source Oral, resp. rate 18, height 6\' 1"  (1.854 m), weight (!) 219.5 kg, SpO2 97 %.  Lungs clear to A&P no rales rhonchi heart regular rhythm no murmurs gallops heaves thrills or rubs   Investigations:  No results found for this or any previous visit (from the past 240 hour(s)).   Basic Metabolic Panel: No results for input(s): NA, K, CL, CO2, GLUCOSE, BUN, CREATININE, CALCIUM, MG, PHOS in the last 72 hours. Liver Function Tests: No results for input(s): AST, ALT, ALKPHOS, BILITOT, PROT, ALBUMIN in the last 72 hours.   CBC: Recent Labs    06/11/18 0551  WBC 5.2  NEUTROABS 2.6  HGB 10.7*  HCT 34.4*  MCV 91.0  PLT 210    No results found.    Medications:   Impression:  Principal Problem:   Cellulitis of left leg Active Problems:   Morbid obesity (HCC)   Asthma   GERD (gastroesophageal reflux disease)   Polysubstance abuse (HCC)   Anemia   Shingles outbreak   Shingles rash     Plan: Tentative discharge for a.m. home health care physical therapy continue continue clindamycin and Valtrex  Consultants: Physical therapy consult   Procedures  Antibiotics: Valtrex and clindamycin          Time spent: 30 minutes   LOS: 4 days   Rickey Farrier M   06/11/2018, 1:11 PM

## 2018-06-11 NOTE — Evaluation (Signed)
Physical Therapy Evaluation Patient Details Name: Albert Klein MRN: [id number] DOB: 12/30/1965 Today's Date: 06/11/2018   History of Present Illness  Albert Klein is a 52 y.o. morbidly obese male with a PMH of polysubstance abuse, recurrent lower extremity cellulitis, asthma and other history detailed below presented to ED with worsening redness, swelling, weeping and tenderness of the left leg that is very similar to other presentations that he has had for cellulitis requiring a prolonged course of IV antibiotics for treatment.  He says that the leg has severe itching and burning.  He says that it started with painful fluid filled blisters that burst.  It is associated with a feeling of burning, pain and fire on leg especially back of calf where it initially started.  He denies fever and chills.  He says that symptoms have progressed since he first began to notice them yesterday.  He denies nausea and emesis.  He has no shortness of breath or chest pain.  He was started on IV clindamycin and admission was requested for further treatment of cellulitis of left leg.  He says that he has chronic pain and is currently going to a pain management clinic.    Clinical Impression  Patient limited for functional mobility as stated below secondary to generalized weakness and fatigue, became slightly SOB with diaphoresis during gait training requiring stand rest break leaning against wall before walking back to room.  Patient states possibly limited due to wearing mask making it difficult to breath (airborne precautions).   Patient will benefit from continued physical therapy in hospital and recommended venue below to increase strength, balance, endurance for safe ADLs and gait.    Follow Up Recommendations Home health PT;Supervision - Intermittent    Equipment Recommendations  Cane(wide based quad cane)    Recommendations for Other Services       Precautions / Restrictions Precautions Precautions:  Fall Restrictions Weight Bearing Restrictions: No      Mobility  Bed Mobility Overal bed mobility: Needs Assistance Bed Mobility: Supine to Sit;Sit to Supine     Supine to sit: Min guard Sit to supine: Modified independent (Device/Increase time)   General bed mobility comments: has difficulty for supine to sit due to generalized weakness  Transfers Overall transfer level: Needs assistance Equipment used: Quad cane Transfers: Sit to/from [employer] Sit to Stand: Supervision Stand pivot transfers: Supervision       General transfer comment: slow labored movement  Ambulation/Gait Ambulation/Gait assistance: Supervision Gait Distance (Feet): 50 Feet Assistive device: Quad cane Gait Pattern/deviations: Step-to pattern;Decreased step length - right;Decreased step length - left;Decreased stance time - left;Decreased stride length Gait velocity: decreased   General Gait Details: slow slightly labored cadence with mostly 3 point gait pattern without loss of balance, required standing rest break by leaning on wall due to c/o fatigue before walking back to bedside  Stairs            Wheelchair Mobility    Modified Rankin (Stroke Patients Only)       Balance Overall balance assessment: Mild deficits observed, not formally tested                                           Pertinent Vitals/Pain Pain Assessment: Faces Faces Pain Scale: Hurts a little bit Pain Location: LLE  Pain Descriptors / Indicators: Discomfort;Sore Pain Intervention(s): Limited activity within patient's  tolerance;Monitored during session    Home Living Family/patient expects to be discharged to:: Private residence Living Arrangements: Non-relatives/Friends Available Help at Discharge: Friend(s) Type of Home: House Home Access: Stairs to enter Entrance Stairs-Rails: None Entrance Stairs-Number of Steps: 4 Home Layout: Two level;1/2 bath on main  level(patient lives on first floor) Home Equipment: Krystal Clarkane - quad;Walker - 2 wheels;Shower seat Additional Comments: his quad cane does not have the prongs, needs new wide based quad cane    Prior Function Level of Independence: Independent with assistive device(s)         Comments: household, short distanced community ambulator with quad-cane     Hand Dominance        Extremity/Trunk Assessment   Upper Extremity Assessment Upper Extremity Assessment: Generalized weakness    Lower Extremity Assessment Lower Extremity Assessment: Generalized weakness    Cervical / Trunk Assessment Cervical / Trunk Assessment: Normal  Communication   Communication: No difficulties  Cognition Arousal/Alertness: Awake/alert Behavior During Therapy: WFL for tasks assessed/performed Overall Cognitive Status: Within Functional Limits for tasks assessed                                        General Comments      Exercises     Assessment/Plan    PT Assessment Patient needs continued PT services  PT Problem List Decreased strength;Decreased activity tolerance;Decreased balance;Decreased mobility       PT Treatment Interventions Gait training;Stair training;Functional mobility training;Therapeutic activities;Therapeutic exercise;Patient/family education    PT Goals (Current goals can be found in the Care Plan section)  Acute Rehab PT Goals Patient Stated Goal: return home able to walk safely PT Goal Formulation: With patient Time For Goal Achievement: 06/15/18 Potential to Achieve Goals: Good    Frequency Min 3X/week   Barriers to discharge        Co-evaluation               AM-PAC PT "6 Clicks" Daily Activity  Outcome Measure Difficulty turning over in bed (including adjusting bedclothes, sheets and blankets)?: None Difficulty moving from lying on back to sitting on the side of the bed? : A Little Difficulty sitting down on and standing up from a chair  with arms (e.g., wheelchair, bedside commode, etc,.)?: None Help needed moving to and from a bed to chair (including a wheelchair)?: None Help needed walking in hospital room?: A Little Help needed climbing 3-5 steps with a railing? : A Little 6 Click Score: 21    End of Session   Activity Tolerance: Patient tolerated treatment well;Patient limited by fatigue Patient left: in bed;with call bell/phone within reach(seated at bedside) Nurse Communication: Mobility status PT Visit Diagnosis: Unsteadiness on feet (R26.81);Other abnormalities of gait and mobility (R26.89);Muscle weakness (generalized) (M62.81)    Time: 2952-84131420-1448 PT Time Calculation (min) (ACUTE ONLY): 28 min   Charges:   PT Evaluation $PT Eval Moderate Complexity: 1 Mod PT Treatments $Therapeutic Activity: 23-37 mins        3:33 PM, 06/11/18 Ocie BobJames Linzey Ramser, MPT Physical Therapist [phone] office [phone] mobile phone

## 2018-06-12 MED ORDER — CLINDAMYCIN HCL 300 MG PO CAPS: 300.0000 mg | ORAL_CAPSULE | Freq: Three times a day (TID) | ORAL | 0 refills | Status: AC

## 2018-06-12 NOTE — Discharge Summary (Signed)
Physician Discharge Summary  Albert Klein T Stengel GEX:[id number] DOB: 08-04-1966 DOA: 06/07/2018  PCP: Oval Linseyondiego, Petra Sargeant, MD  Admit date: 06/07/2018 Discharge date: 06/12/2018   Recommendations for Outpatient Follow-up:  Patient is instructed to take clindamycin 300 mg p.o. 3 times daily for 10 additional days and to resume all other previous hospital admission medicines he is likewise going to follow-up with home health RN and physical therapy he is urged to ambulate and keep wound clean with soap and water 3 times daily only not to keep  Covered with a bandage Discharge Diagnoses:  Principal Problem:   Cellulitis of left leg Active Problems:   Morbid obesity (HCC)   Asthma   GERD (gastroesophageal reflux disease)   Polysubstance abuse (HCC)   Anemia   Shingles outbreak   Shingles rash   Discharge Condition: Good  Filed Weights   06/07/18 0909 06/07/18 1835  Weight: (!) 191.9 kg (!) 219.5 kg    History of present illness:  Patient is a 52 year old severely morbidly obese white male well-known to me who was admitted with exacerbation of tenderness rubor and cellulitis of his left lower extremity some of the lesions look herpetic therefore he was placed on Valtrex empirically with contact precautions and isolation he likewise was treated with IV clindamycin 600 mg p.o. IV 3 times daily he was seen by the wound care team for cleansing and debridement this improved over a 5-day.  He had seen by physical therapy and urged to walk was felt he would need the assistance of physical therapy as well as an Charity fundraiserN at home and he was discharged on all his previous hospital medicines with the addition of clindamycin 300 mg p.o. 3 times daily for an additional 10 days he is urged to follow-up in the office within 1 week's time  Hospital Course:  See HPI above  Procedures: Wound debridement by wound care team Consultations:  Wound care team and physical therapy  Discharge Instructions  Discharge  Instructions    Discharge instructions   Complete by:  As directed      Allergies as of 06/12/2018   No Known Allergies     Medication List    TAKE these medications   albuterol 108 (90 Base) MCG/ACT inhaler Commonly known as:  PROVENTIL HFA;VENTOLIN HFA Inhale 2 puffs into the lungs every 6 (six) hours as needed for wheezing or shortness of breath.   baclofen 10 MG tablet Commonly known as:  LIORESAL Take 10 mg by mouth 3 (three) times daily.   clindamycin 300 MG capsule Commonly known as:  CLEOCIN Take 1 capsule (300 mg total) by mouth 3 (three) times daily for 10 days.   gabapentin 300 MG capsule Commonly known as:  NEURONTIN Take 300 mg by mouth 4 (four) times daily.   nabumetone 500 MG tablet Commonly known as:  RELAFEN Take 500 mg by mouth 2 (two) times daily.   oxyCODONE 15 MG immediate release tablet Commonly known as:  ROXICODONE Take 15 mg by mouth 4 (four) times daily - after meals and at bedtime.            Durable Medical Equipment  (From admission, onward)         Start     Ordered   06/12/18 0855  For home use only DME Cane  Once    Comments:  Wide base quad cane   06/12/18 0854         No Known Allergies    The results  of significant diagnostics from this hospitalization (including imaging, microbiology, ancillary and laboratory) are listed below for reference.    Significant Diagnostic Studies: No results found.  Microbiology: No results found for this or any previous visit (from the past 240 hour(s)).   Labs: Basic Metabolic Panel: Recent Labs  Lab 06/07/18 1000 06/08/18 0634  NA 137 137  K 4.2 3.9  CL 104 105  CO2 26 26  GLUCOSE 98 130*  BUN 18 19  CREATININE 1.03 1.07  CALCIUM 8.8* 8.5*  MG  --  1.9   Liver Function Tests: Recent Labs  Lab 06/08/18 0634  AST 18  ALT 9  ALKPHOS 53  BILITOT 0.7  PROT 5.8*  ALBUMIN 3.0*   No results for input(s): LIPASE, AMYLASE in the last 168 hours. No results for input(s):  AMMONIA in the last 168 hours. CBC: Recent Labs  Lab 06/07/18 1000 06/08/18 0634 06/11/18 0551  WBC 5.3 4.0 5.2  NEUTROABS 3.2 2.0 2.6  HGB 12.0* 10.5* 10.7*  HCT 38.1* 33.1* 34.4*  MCV 91.6 90.9 91.0  PLT 209 168 210   Cardiac Enzymes: No results for input(s): CKTOTAL, CKMB, CKMBINDEX, TROPONINI in the last 168 hours. BNP: BNP (last 3 results) No results for input(s): BNP in the last 8760 hours.  ProBNP (last 3 results) No results for input(s): PROBNP in the last 8760 hours.  CBG: No results for input(s): GLUCAP in the last 168 hours.     Signed:  Isabella Stalling   Pager: [phone] 06/12/2018, 11:16 AM

## 2018-06-12 NOTE — Progress Notes (Signed)
Pt discharged home today per Dr. Dondiego. Pt's IV site D/C'd and WDL. Pt's VSS. Pt provided with home medication list, discharge instructions and prescriptions. Verbalized understanding. Pt left floor via WC in stable condition accompanied by NT. 

## 2018-06-12 NOTE — Progress Notes (Signed)
Physical Therapy Treatment Patient Details Name: Albert Klein MRN: [id number] DOB: 1966/08/06 Today's Date: 06/12/2018    History of Present Illness Albert Klein is a 52 y.o. morbidly obese male with a PMH of polysubstance abuse, recurrent lower extremity cellulitis, asthma and other history detailed below presented to ED with worsening redness, swelling, weeping and tenderness of the left leg that is very similar to other presentations that he has had for cellulitis requiring a prolonged course of IV antibiotics for treatment.  He says that the leg has severe itching and burning.  He says that it started with painful fluid filled blisters that burst.  It is associated with a feeling of burning, pain and fire on leg especially back of calf where it initially started.  He denies fever and chills.  He says that symptoms have progressed since he first began to notice them yesterday.  He denies nausea and emesis.  He has no shortness of breath or chest pain.  He was started on IV clindamycin and admission was requested for further treatment of cellulitis of left leg.  He says that he has chronic pain and is currently going to a pain management clinic.    PT Comments    Patient demonstrates improvement for sitting up at bedside and increased endurance/distance for gait training without loss of balance, limited mostly due to fatigue and having to wear mask out side of room.  Patient will benefit from continued physical therapy in hospital and recommended venue below to increase strength, balance, endurance for safe ADLs and gait.    Follow Up Recommendations  Home health PT;Supervision - Intermittent     Equipment Recommendations  Cane(wide based quad cane)    Recommendations for Other Services       Precautions / Restrictions Precautions Precautions: None Restrictions Weight Bearing Restrictions: No    Mobility  Bed Mobility Overal bed mobility: Modified Independent Bed Mobility: Supine  to Sit;Sit to Supine     Supine to sit: Modified independent (Device/Increase time) Sit to supine: Modified independent (Device/Increase time)   General bed mobility comments: improvement for using BUE to sit up at bedside  Transfers Overall transfer level: Modified independent Equipment used: Quad cane Transfers: Sit to/from [employer];Stand Pivot Transfers           General transfer comment: near normal  Ambulation/Gait Ambulation/Gait assistance: Chief Operating Officerupervision Gait Distance (Feet): 80 Feet Assistive device: Quad cane Gait Pattern/deviations: Step-through pattern;Decreased step length - left;Decreased stance time - right;Decreased stride length Gait velocity: decreased   General Gait Details: increased endurance/distance for gait training with mostly 2 point gait pattern, no loss of balance, limited mostly due to c/o fatigue   Stairs             Wheelchair Mobility    Modified Rankin (Stroke Patients Only)       Balance Overall balance assessment: Mild deficits observed, not formally tested                                          Cognition Arousal/Alertness: Awake/alert Behavior During Therapy: WFL for tasks assessed/performed Overall Cognitive Status: Within Functional Limits for tasks assessed                                        Exercises  General Comments        Pertinent Vitals/Pain Pain Assessment: No/denies pain    Home Living                      Prior Function            PT Goals (current goals can now be found in the care plan section) Acute Rehab PT Goals Patient Stated Goal: return home able to walk safely PT Goal Formulation: With patient Time For Goal Achievement: 06/15/18 Potential to Achieve Goals: Good Progress towards PT goals: Progressing toward goals    Frequency    Min 3X/week      PT Plan Current plan remains appropriate    Co-evaluation               AM-PAC PT "6 Clicks" Daily Activity  Outcome Measure  Difficulty turning over in bed (including adjusting bedclothes, sheets and blankets)?: None Difficulty moving from lying on back to sitting on the side of the bed? : None Difficulty sitting down on and standing up from a chair with arms (e.g., wheelchair, bedside commode, etc,.)?: None Help needed moving to and from a bed to chair (including a wheelchair)?: None Help needed walking in hospital room?: A Little Help needed climbing 3-5 steps with a railing? : A Little 6 Click Score: 22    End of Session   Activity Tolerance: Patient tolerated treatment well;Patient limited by fatigue Patient left: in bed;with call bell/phone within reach(seated at bedside) Nurse Communication: Mobility status PT Visit Diagnosis: Unsteadiness on feet (R26.81);Other abnormalities of gait and mobility (R26.89);Muscle weakness (generalized) (M62.81)     Time: 4098-1191 PT Time Calculation (min) (ACUTE ONLY): 28 min  Charges:  $Therapeutic Activity: 23-37 mins                     1:46 PM, 06/12/18 Ocie Bob, MPT Physical Therapist with [facility] [phone] office [phone] mobile phone

## 2018-06-12 NOTE — Care Management (Signed)
Update from note on 06/11/18. [facility] not able to provide quad cane that can support pt's weight. [facility] has cane that cane, referral faxed. Pt has requested light weight cane but CM has explained there are not light weight options that can support his weight. Pt understands, requests cane be delivered to his home.

## 2018-06-16 NOTE — ED Notes (Signed)
Pt called requesting what pharmacy he had listed as his preferred pharmacy.

## 2019-09-14 ENCOUNTER — Inpatient Hospital Stay (HOSPITAL_COMMUNITY)
Admission: EM | Admit: 2019-09-14 | Discharge: 2019-10-05 | DRG: 871 | Disposition: E | Payer: Medicaid Other | Attending: Emergency Medicine | Admitting: Emergency Medicine

## 2019-09-14 ENCOUNTER — Emergency Department (HOSPITAL_COMMUNITY): Payer: Medicaid Other

## 2019-09-14 ENCOUNTER — Encounter (HOSPITAL_COMMUNITY): Payer: Self-pay | Admitting: *Deleted

## 2019-09-14 ENCOUNTER — Other Ambulatory Visit: Payer: Self-pay

## 2019-09-14 DIAGNOSIS — F1290 Cannabis use, unspecified, uncomplicated: Secondary | ICD-10-CM | POA: Diagnosis present

## 2019-09-14 DIAGNOSIS — G9340 Encephalopathy, unspecified: Secondary | ICD-10-CM | POA: Diagnosis not present

## 2019-09-14 DIAGNOSIS — D649 Anemia, unspecified: Secondary | ICD-10-CM | POA: Diagnosis present

## 2019-09-14 DIAGNOSIS — Z515 Encounter for palliative care: Secondary | ICD-10-CM | POA: Diagnosis not present

## 2019-09-14 DIAGNOSIS — R197 Diarrhea, unspecified: Secondary | ICD-10-CM | POA: Diagnosis present

## 2019-09-14 DIAGNOSIS — L03119 Cellulitis of unspecified part of limb: Secondary | ICD-10-CM | POA: Diagnosis not present

## 2019-09-14 DIAGNOSIS — F1910 Other psychoactive substance abuse, uncomplicated: Secondary | ICD-10-CM | POA: Diagnosis present

## 2019-09-14 DIAGNOSIS — E872 Acidosis: Secondary | ICD-10-CM | POA: Diagnosis present

## 2019-09-14 DIAGNOSIS — W010XXA Fall on same level from slipping, tripping and stumbling without subsequent striking against object, initial encounter: Secondary | ICD-10-CM | POA: Diagnosis present

## 2019-09-14 DIAGNOSIS — N1330 Unspecified hydronephrosis: Secondary | ICD-10-CM

## 2019-09-14 DIAGNOSIS — E860 Dehydration: Secondary | ICD-10-CM | POA: Diagnosis present

## 2019-09-14 DIAGNOSIS — A419 Sepsis, unspecified organism: Secondary | ICD-10-CM | POA: Diagnosis present

## 2019-09-14 DIAGNOSIS — J449 Chronic obstructive pulmonary disease, unspecified: Secondary | ICD-10-CM | POA: Diagnosis present

## 2019-09-14 DIAGNOSIS — G928 Other toxic encephalopathy: Secondary | ICD-10-CM | POA: Diagnosis present

## 2019-09-14 DIAGNOSIS — K219 Gastro-esophageal reflux disease without esophagitis: Secondary | ICD-10-CM | POA: Diagnosis present

## 2019-09-14 DIAGNOSIS — Z833 Family history of diabetes mellitus: Secondary | ICD-10-CM

## 2019-09-14 DIAGNOSIS — F1120 Opioid dependence, uncomplicated: Secondary | ICD-10-CM | POA: Diagnosis present

## 2019-09-14 DIAGNOSIS — J9601 Acute respiratory failure with hypoxia: Secondary | ICD-10-CM | POA: Diagnosis present

## 2019-09-14 DIAGNOSIS — N179 Acute kidney failure, unspecified: Secondary | ICD-10-CM | POA: Diagnosis present

## 2019-09-14 DIAGNOSIS — L02419 Cutaneous abscess of limb, unspecified: Secondary | ICD-10-CM | POA: Diagnosis present

## 2019-09-14 DIAGNOSIS — L03116 Cellulitis of left lower limb: Secondary | ICD-10-CM | POA: Diagnosis present

## 2019-09-14 DIAGNOSIS — Z66 Do not resuscitate: Secondary | ICD-10-CM | POA: Diagnosis not present

## 2019-09-14 DIAGNOSIS — R451 Restlessness and agitation: Secondary | ICD-10-CM | POA: Diagnosis not present

## 2019-09-14 DIAGNOSIS — Z713 Dietary counseling and surveillance: Secondary | ICD-10-CM

## 2019-09-14 DIAGNOSIS — Z23 Encounter for immunization: Secondary | ICD-10-CM | POA: Diagnosis not present

## 2019-09-14 DIAGNOSIS — I890 Lymphedema, not elsewhere classified: Secondary | ICD-10-CM | POA: Diagnosis present

## 2019-09-14 DIAGNOSIS — J9621 Acute and chronic respiratory failure with hypoxia: Secondary | ICD-10-CM | POA: Diagnosis not present

## 2019-09-14 DIAGNOSIS — Z22322 Carrier or suspected carrier of Methicillin resistant Staphylococcus aureus: Secondary | ICD-10-CM

## 2019-09-14 DIAGNOSIS — L02416 Cutaneous abscess of left lower limb: Secondary | ICD-10-CM | POA: Diagnosis present

## 2019-09-14 DIAGNOSIS — G8929 Other chronic pain: Secondary | ICD-10-CM | POA: Diagnosis present

## 2019-09-14 DIAGNOSIS — G92 Toxic encephalopathy: Secondary | ICD-10-CM | POA: Diagnosis present

## 2019-09-14 DIAGNOSIS — Z6841 Body Mass Index (BMI) 40.0 and over, adult: Secondary | ICD-10-CM | POA: Diagnosis not present

## 2019-09-14 DIAGNOSIS — Z20828 Contact with and (suspected) exposure to other viral communicable diseases: Secondary | ICD-10-CM | POA: Diagnosis present

## 2019-09-14 DIAGNOSIS — J9622 Acute and chronic respiratory failure with hypercapnia: Secondary | ICD-10-CM | POA: Diagnosis not present

## 2019-09-14 DIAGNOSIS — R6521 Severe sepsis with septic shock: Secondary | ICD-10-CM | POA: Diagnosis present

## 2019-09-14 DIAGNOSIS — J45909 Unspecified asthma, uncomplicated: Secondary | ICD-10-CM | POA: Diagnosis present

## 2019-09-14 DIAGNOSIS — Z79899 Other long term (current) drug therapy: Secondary | ICD-10-CM

## 2019-09-14 DIAGNOSIS — E8729 Other acidosis: Secondary | ICD-10-CM | POA: Diagnosis present

## 2019-09-14 HISTORY — DX: Prediabetes: R73.03

## 2019-09-14 HISTORY — DX: Chronic obstructive pulmonary disease, unspecified: J44.9

## 2019-09-14 LAB — COMPREHENSIVE METABOLIC PANEL
ALT: 21 U/L (ref 0–44)
AST: 41 U/L (ref 15–41)
Albumin: 3.2 g/dL — ABNORMAL LOW (ref 3.5–5.0)
Alkaline Phosphatase: 51 U/L (ref 38–126)
Anion gap: 14 (ref 5–15)
BUN: 23 mg/dL — ABNORMAL HIGH (ref 6–20)
CO2: 20 mmol/L — ABNORMAL LOW (ref 22–32)
Calcium: 8.5 mg/dL — ABNORMAL LOW (ref 8.9–10.3)
Chloride: 99 mmol/L (ref 98–111)
Creatinine, Ser: 2.38 mg/dL — ABNORMAL HIGH (ref 0.61–1.24)
GFR calc Af Amer: 35 mL/min — ABNORMAL LOW (ref >60)
GFR calc non Af Amer: 30 mL/min — ABNORMAL LOW (ref >60)
Glucose, Bld: 85 mg/dL (ref 70–99)
Potassium: 3.8 mmol/L (ref 3.5–5.1)
Sodium: 133 mmol/L — ABNORMAL LOW (ref 135–145)
Total Bilirubin: 1.2 mg/dL (ref 0.3–1.2)
Total Protein: 6.2 g/dL — ABNORMAL LOW (ref 6.5–8.1)

## 2019-09-14 LAB — CBC WITH DIFFERENTIAL/PLATELET
Abs Immature Granulocytes: 0.04 10*3/uL (ref 0.00–0.07)
Basophils Absolute: 0.1 10*3/uL (ref 0.0–0.1)
Basophils Relative: 1 %
Eosinophils Absolute: 0 10*3/uL (ref 0.0–0.5)
Eosinophils Relative: 0 %
HCT: 43.3 % (ref 39.0–52.0)
Hemoglobin: 13.5 g/dL (ref 13.0–17.0)
Immature Granulocytes: 1 %
Lymphocytes Relative: 2 %
Lymphs Abs: 0.2 10*3/uL — ABNORMAL LOW (ref 0.7–4.0)
MCH: 29.7 pg (ref 26.0–34.0)
MCHC: 31.2 g/dL (ref 30.0–36.0)
MCV: 95.2 fL (ref 80.0–100.0)
Monocytes Absolute: 0.3 10*3/uL (ref 0.1–1.0)
Monocytes Relative: 4 %
Neutro Abs: 7.5 10*3/uL (ref 1.7–7.7)
Neutrophils Relative %: 92 %
Platelets: 196 10*3/uL (ref 150–400)
RBC: 4.55 MIL/uL (ref 4.22–5.81)
RDW: 14.3 % (ref 11.5–15.5)
WBC: 8 10*3/uL (ref 4.0–10.5)
nRBC: 0.4 % — ABNORMAL HIGH (ref 0.0–0.2)

## 2019-09-14 LAB — PROTIME-INR
INR: 1.2 (ref 0.8–1.2)
Prothrombin Time: 14.6 seconds (ref 11.4–15.2)

## 2019-09-14 LAB — LACTIC ACID, PLASMA
Lactic Acid, Venous: 6.4 mmol/L (ref 0.5–1.9)
Lactic Acid, Venous: 5.4 mmol/L (ref 0.5–1.9)
Lactic Acid, Venous: 6 mmol/L (ref 0.5–1.9)

## 2019-09-14 LAB — APTT: aPTT: 32 seconds (ref 24–36)

## 2019-09-14 LAB — CK: Total CK: 536 U/L — ABNORMAL HIGH (ref 49–397)

## 2019-09-14 MED ORDER — ALBUTEROL SULFATE (2.5 MG/3ML) 0.083% IN NEBU: 3.0000 mL | INHALATION_SOLUTION | Freq: Four times a day (QID) | RESPIRATORY_TRACT | Status: DC | PRN

## 2019-09-14 MED ORDER — ACETAMINOPHEN 325 MG PO TABS
650.0000 mg | ORAL_TABLET | Freq: Four times a day (QID) | ORAL | Status: DC | PRN
  Administered 2019-09-14: 22:00:00 650 mg via ORAL
  Filled 2019-09-14: qty 2

## 2019-09-14 MED ORDER — SODIUM CHLORIDE 0.9 % IV SOLN: 250.0000 mL | INTRAVENOUS | Status: DC | PRN

## 2019-09-14 MED ORDER — BISACODYL 10 MG RE SUPP: 10.0000 mg | Freq: Every day | RECTAL | Status: DC | PRN

## 2019-09-14 MED ORDER — ONDANSETRON HCL 4 MG PO TABS: 4.0000 mg | ORAL_TABLET | Freq: Four times a day (QID) | ORAL | Status: DC | PRN

## 2019-09-14 MED ORDER — BACLOFEN 10 MG PO TABS
5.0000 mg | ORAL_TABLET | Freq: Three times a day (TID) | ORAL | Status: DC
  Administered 2019-09-14 – 2019-09-15 (×3): 5 mg via ORAL
  Filled 2019-09-14 (×3): qty 1

## 2019-09-14 MED ORDER — POLYETHYLENE GLYCOL 3350 17 G PO PACK: 17.0000 g | PACK | Freq: Every day | ORAL | Status: DC | PRN

## 2019-09-14 MED ORDER — SODIUM CHLORIDE 0.9% FLUSH
3.0000 mL | Freq: Two times a day (BID) | INTRAVENOUS | Status: DC
  Administered 2019-09-15 – 2019-09-16 (×3): 3 mL via INTRAVENOUS

## 2019-09-14 MED ORDER — LACTATED RINGERS IV SOLN
INTRAVENOUS | Status: DC
  Administered 2019-09-14 – 2019-09-15 (×3): via INTRAVENOUS

## 2019-09-14 MED ORDER — SODIUM CHLORIDE 0.9 % IV SOLN
2.0000 g | Freq: Three times a day (TID) | INTRAVENOUS | Status: DC
  Administered 2019-09-15 – 2019-09-17 (×7): 2 g via INTRAVENOUS
  Filled 2019-09-14 (×7): qty 2

## 2019-09-14 MED ORDER — VANCOMYCIN HCL 10 G IV SOLR: 2000.0000 mg | Freq: Once | INTRAVENOUS | Status: DC

## 2019-09-14 MED ORDER — LACTATED RINGERS IV BOLUS (SEPSIS)
1000.0000 mL | Freq: Once | INTRAVENOUS | Status: AC
  Administered 2019-09-14: 18:00:00 1000 mL via INTRAVENOUS

## 2019-09-14 MED ORDER — HEPARIN SODIUM (PORCINE) 5000 UNIT/ML IJ SOLN
5000.0000 [IU] | Freq: Three times a day (TID) | INTRAMUSCULAR | Status: DC
  Administered 2019-09-14 – 2019-09-17 (×8): 5000 [IU] via SUBCUTANEOUS
  Filled 2019-09-14 (×8): qty 1

## 2019-09-14 MED ORDER — SODIUM CHLORIDE 0.9 % IV SOLN
2.0000 g | Freq: Once | INTRAVENOUS | Status: AC
  Administered 2019-09-14: 22:00:00 2 g via INTRAVENOUS
  Filled 2019-09-14: qty 2

## 2019-09-14 MED ORDER — MORPHINE SULFATE (PF) 2 MG/ML IV SOLN
2.0000 mg | INTRAVENOUS | Status: DC | PRN
  Administered 2019-09-15: 2 mg via INTRAVENOUS
  Filled 2019-09-14: qty 1

## 2019-09-14 MED ORDER — ONDANSETRON HCL 4 MG/2ML IJ SOLN: 4.0000 mg | Freq: Four times a day (QID) | INTRAMUSCULAR | Status: DC | PRN

## 2019-09-14 MED ORDER — TRAMADOL HCL 50 MG PO TABS
50.0000 mg | ORAL_TABLET | Freq: Three times a day (TID) | ORAL | Status: DC | PRN
  Administered 2019-09-14: 50 mg via ORAL
  Filled 2019-09-14: qty 1

## 2019-09-14 MED ORDER — FENTANYL CITRATE (PF) 100 MCG/2ML IJ SOLN
50.0000 ug | Freq: Once | INTRAMUSCULAR | Status: AC
  Administered 2019-09-14: 17:00:00 50 ug via INTRAMUSCULAR

## 2019-09-14 MED ORDER — FENTANYL CITRATE (PF) 100 MCG/2ML IJ SOLN
INTRAMUSCULAR | Status: AC
  Administered 2019-09-14: 50 ug via INTRAMUSCULAR
  Filled 2019-09-14: qty 2

## 2019-09-14 MED ORDER — VANCOMYCIN HCL IN DEXTROSE 1-5 GM/200ML-% IV SOLN
1000.0000 mg | Freq: Once | INTRAVENOUS | Status: AC
  Administered 2019-09-15: 1000 mg via INTRAVENOUS
  Filled 2019-09-14: qty 200

## 2019-09-14 MED ORDER — SODIUM CHLORIDE 0.9 % IV SOLN
2.0000 g | INTRAVENOUS | Status: DC
  Administered 2019-09-14: 2 g via INTRAVENOUS
  Filled 2019-09-14: qty 20

## 2019-09-14 MED ORDER — GABAPENTIN 100 MG PO CAPS
200.0000 mg | ORAL_CAPSULE | Freq: Three times a day (TID) | ORAL | Status: DC
  Administered 2019-09-14: 22:00:00 200 mg via ORAL
  Filled 2019-09-14: qty 2

## 2019-09-14 MED ORDER — LACTATED RINGERS IV BOLUS (SEPSIS)
400.0000 mL | Freq: Once | INTRAVENOUS | Status: AC
  Administered 2019-09-14: 19:00:00 400 mL via INTRAVENOUS

## 2019-09-14 MED ORDER — SODIUM CHLORIDE 0.9% FLUSH: 3.0000 mL | INTRAVENOUS | Status: DC | PRN

## 2019-09-14 MED ORDER — ACETAMINOPHEN 650 MG RE SUPP: 650.0000 mg | Freq: Four times a day (QID) | RECTAL | Status: DC | PRN

## 2019-09-14 MED ORDER — VANCOMYCIN HCL IN DEXTROSE 1-5 GM/200ML-% IV SOLN
1000.0000 mg | Freq: Once | INTRAVENOUS | Status: AC
  Administered 2019-09-14: 22:00:00 1000 mg via INTRAVENOUS
  Filled 2019-09-14: qty 200

## 2019-09-14 MED ORDER — VANCOMYCIN HCL 10 G IV SOLR
2000.0000 mg | INTRAVENOUS | Status: DC
  Filled 2019-09-14 (×2): qty 2000

## 2019-09-14 MED ORDER — MORPHINE SULFATE (PF) 2 MG/ML IV SOLN: 1.0000 mg | INTRAVENOUS | Status: DC | PRN

## 2019-09-14 MED ORDER — OXYCODONE HCL 5 MG PO TABS
15.0000 mg | ORAL_TABLET | Freq: Three times a day (TID) | ORAL | Status: DC
  Administered 2019-09-14 – 2019-09-15 (×2): 15 mg via ORAL
  Filled 2019-09-14 (×2): qty 3

## 2019-09-14 MED ORDER — NOREPINEPHRINE 4 MG/250ML-% IV SOLN
0.0000 ug/min | INTRAVENOUS | Status: DC
  Administered 2019-09-14: 18:00:00 2 ug/min via INTRAVENOUS
  Administered 2019-09-15: 40 ug/min via INTRAVENOUS
  Administered 2019-09-15: 07:00:00 80 ug/min via INTRAVENOUS
  Filled 2019-09-14 (×2): qty 250
  Filled 2019-09-14: qty 500
  Filled 2019-09-14: qty 250

## 2019-09-14 NOTE — ED Notes (Signed)
Date and time results received: 09/12/2019 6:28 PM  (use smartphrase ".now" to insert current time)  Test: lactic Critical Value: 6.4  Name of Provider Notified: Dr Sabra Heck   Orders Received? Or Actions Taken?:

## 2019-09-14 NOTE — H&P (Addendum)
History and Physical    Albert Klein:[id number] DOB: 1966/07/10 DOA: 09/16/2019  PCP: Lucia Gaskins, MD  Patient coming from: Home  I have personally briefly reviewed patient's old medical records in [place]  Chief Complaint: Left lower extremity swelling, pain  HPI: Albert Klein is a 53 y.o. male with medical history significant of morbid obesity, recurrent lower extremity cellulitis, asthma, back pain, degenerative joint disease, history of substance abuse who presented to the ER with increased left lower extremity pain, swelling, redness.  Patient has a history of chronic lymphedema bilaterally.  He reported that he accidentally urinated on his leg yesterday and then had a fall in the bathroom.  He reports that this morning he started having increased redness, pain in the left lower extremity.  This was associated with weakness as well as subjective fevers.  Pain got progressively worse through the day.  No exacerbating or relieving factors.  Was present all throughout the left leg and he had difficulty with ambulation due to the same.  He also reported feeling lightheaded and dizzy. No nausea, vomiting, abdominal pain, chest pain, shortness of breath, wheezing, dysuria, seizures, syncope, insect bite, diarrhea. ED Course:  Vital Signs reviewed on presentation, significant for temperature 98.6, heart rate 118, respiratory rate 37, blood pressure 102/54, saturation 94% on room air. Labs reviewed, significant for sodium 133, potassium 3.8, bicarb 20, BUN 23, creatinine 2.38, LFTs within normal limits, lactic acid 6.4, WBC count 8.0, hemoglobin 13.5, hematocrit 43, platelets 196, SARS-CoV-2 RT-PCR has been sent.  Blood cultures have been ordered.  Urinalysis is still pending. Imaging personally Reviewed, chest x-ray shows cardiomegaly, no acute infiltrates, no evidence of pulmonary edema. EKG personally reviewed, shows sinus tachycardia, IVCD, left axis deviation. As noted above  patient has significantly elevated lactic acid and he was also hypotensive on presentation.  Peripheral access was difficult to obtain in the ER and patient had a right intrajugular central venous catheter placed.  Review of Systems: As per HPI otherwise 10 point review of systems negative.  All other review of systems is negative except the ones noted above in the HPI.  Past Medical History:  Diagnosis Date  . Arthritis   . Asthma   . Back pain   . Borderline diabetes   . Cellulitis of left leg   . COPD (chronic obstructive pulmonary disease) ([employer])   . DJD (degenerative joint disease)   . Homeless   . Kidney stones   . Obesity   . Polysubstance abuse ([place])     History reviewed. No pertinent surgical history.   reports that he has never smoked. He has never used smokeless tobacco. He reports previous drug use. Drug: Marijuana. He reports that he does not drink alcohol.  No Known Allergies  Family History  Problem Relation Age of Onset  . Cancer Mother   . Emphysema Mother   . Diabetes type II Mother   . Heart failure Mother   . Cancer Father   . Diabetes type II Father    Family history reviewed, noted as above, not pertinent to current presentation.   Prior to Admission medications   Medication Sig Start Date End Date Taking? Authorizing Provider  albuterol (PROVENTIL HFA;VENTOLIN HFA) 108 (90 BASE) MCG/ACT inhaler Inhale 2 puffs into the lungs every 6 (six) hours as needed for wheezing or shortness of breath.    [provider]  baclofen (LIORESAL) 10 MG tablet Take 10 mg by mouth 3 (three) times daily.  [provider]  gabapentin (NEURONTIN) 300 MG capsule Take 300 mg by mouth 4 (four) times daily.    [provider]  nabumetone (RELAFEN) 500 MG tablet Take 500 mg by mouth 2 (two) times daily.    [provider]  oxyCODONE (ROXICODONE) 15 MG immediate release tablet Take 15 mg by mouth 4 (four) times daily - after meals and at  bedtime.    [provider]    Physical Exam: Vitals:   09/26/2019 1846 09/10/2019 1850 09/22/2019 1852 09/21/2019 1920  BP:  (!) 102/54  116/90  Pulse:      Resp: (!) 40 18 (!) 37   Temp:      TempSrc:      SpO2: 95% 94%  99%  Weight:      Height:        Constitutional: NAD, patient in moderate distress due to pain Vitals:   09/30/2019 1846 09/22/2019 1850 09/20/2019 1852 10/01/2019 1920  BP:  (!) 102/54  116/90  Pulse:      Resp: (!) 40 18 (!) 37   Temp:      TempSrc:      SpO2: 95% 94%  99%  Weight:      Height:       Eyes: PERRL, lids and conjunctivae normal ENMT: Mucous membranes are dry.  Neck: normal, supple, no masses, no thyromegaly Respiratory: Decreased air entry bilaterally, no obvious crepitations.  No wheezing.  No accessory muscle use. Cardiovascular: Tachycardia noted, normal rhythm, no murmurs / rubs / gallops Hard to palpate peripheral pulses but normal capillary refill. Abdomen: no tenderness, no masses palpated. No hepatosplenomegaly. Bowel sounds positive.  Musculoskeletal: no clubbing / cyanosis. No joint deformity upper and lower extremities. Good ROM, no contractures. Normal muscle tone.  Bilateral chronic lymphedema with 4+ pedal edema, chronic skin changes, left lower extremity has increased redness, tenderness, warmth extending from the ankle all the way up to the thigh.  Small open areas noted on the left side of the scrotum.  No evidence of gangrene. Skin: no rashes, lesions, ulcers. No induration Neurologic: CN 2-12 grossly intact. Sensation intact, DTR normal. Strength 5/5 in all 4.  Psychiatric: Normal judgment and insight. Alert and oriented x 3. Normal mood.    Decubitus Ulcers: Not present on admission Catheters and tubes: None   Labs on Admission: I have personally reviewed following labs and imaging studies  CBC: Recent Labs  Lab 09/23/2019 1737  WBC 8.0  NEUTROABS 7.5  HGB 13.5  HCT 43.3  MCV 95.2  PLT 196   Basic Metabolic Panel:  Recent Labs  Lab 10/02/2019 1737  NA 133*  K 3.8  CL 99  CO2 20*  GLUCOSE 85  BUN 23*  CREATININE 2.38*  CALCIUM 8.5*   GFR: Estimated Creatinine Clearance: 80.5 mL/min (A) (by C-G formula based on SCr of 2.38 mg/dL (H)). Liver Function Tests: Recent Labs  Lab 09/24/2019 1737  AST 41  ALT 21  ALKPHOS 51  BILITOT 1.2  PROT 6.2*  ALBUMIN 3.2*   No results for input(s): LIPASE, AMYLASE in the last 168 hours. No results for input(s): AMMONIA in the last 168 hours. Coagulation Profile: Recent Labs  Lab 09/18/2019 1737  INR 1.2   Cardiac Enzymes: No results for input(s): CKTOTAL, CKMB, CKMBINDEX, TROPONINI in the last 168 hours. BNP (last 3 results) No results for input(s): PROBNP in the last 8760 hours. HbA1C: No results for input(s): HGBA1C in the last 72 hours. CBG: No results for  input(s): GLUCAP in the last 168 hours. Lipid Profile: No results for input(s): CHOL, HDL, LDLCALC, TRIG, CHOLHDL, LDLDIRECT in the last 72 hours. Thyroid Function Tests: No results for input(s): TSH, T4TOTAL, FREET4, T3FREE, THYROIDAB in the last 72 hours. Anemia Panel: No results for input(s): VITAMINB12, FOLATE, FERRITIN, TIBC, IRON, RETICCTPCT in the last 72 hours. Urine analysis:    Component Value Date/Time   COLORURINE YELLOW 06/29/2011 0811   APPEARANCEUR CLEAR 06/29/2011 0811   LABSPEC 1.020 06/29/2011 0811   PHURINE 5.5 06/29/2011 0811   GLUCOSEU NEGATIVE 06/29/2011 0811   HGBUR NEGATIVE 06/29/2011 0811   BILIRUBINUR NEGATIVE 06/29/2011 0811   KETONESUR NEGATIVE 06/29/2011 0811   PROTEINUR NEGATIVE 06/29/2011 0811   UROBILINOGEN 0.2 06/29/2011 0811   NITRITE NEGATIVE 06/29/2011 0811   LEUKOCYTESUR NEGATIVE 06/29/2011 0811    Radiological Exams on Admission: Dg Chest Portable 1 View  Result Date: 26-Sep-2019 CLINICAL DATA:  Central line placement. EXAM: PORTABLE CHEST 1 VIEW COMPARISON:  Radiograph earlier this day. FINDINGS: Right internal jugular central venous catheter  tip projects over the upper SVC. No visualized pneumothorax. Cardiomegaly is unchanged. No focal airspace disease. Degenerative change of the right shoulder. IMPRESSION: 1. Right internal jugular central venous catheter tip projects over the upper SVC. No pneumothorax. 2. Unchanged cardiomegaly. Electronically Signed   By: Narda Rutherford M.D.   On: 2019/09/26 18:10   Dg Chest Port 1 View  Result Date: 09-26-19 CLINICAL DATA:  Sepsis this. Left leg pain, redness and swelling. History of cellulitis. EXAM: PORTABLE CHEST 1 VIEW COMPARISON:  06/26/2011 FINDINGS: Mild cardiomegaly. Normal mediastinal contours. No evidence of focal airspace disease. No large pleural effusion or pneumothorax. No pulmonary edema. Soft tissue attenuation from body habitus limits detailed assessment. IMPRESSION: Mild cardiomegaly. No evidence of congestive failure or pneumonia. Electronically Signed   By: Narda Rutherford M.D.   On: Sep 26, 2019 17:33      Assessment/Plan Active Problems:   Cellulitis and abscess of leg   ARF (acute renal failure) (HCC)   Morbid obesity (HCC)   Asthma   GERD (gastroesophageal reflux disease)   Polysubstance abuse (HCC)   Anemia     Principal Problem: Severe sepsis with septic shock secondary to left lower extremity cellulitis: Patient presented with left lower extremity swelling, redness, pain.  Has chronic bilateral lower extremity lymphedema with morbid obesity and has had recurrent episodes of cellulitis in the past.  Lactic acid was 6.4 on presentation.  Patient noted to be hypotensive.  Right internal jugular central venous catheter was placed by ER.  Patient has been started on norepinephrine.  Left lower extremity x-ray shows no evidence of subcutaneous air.  No crepitus noted on exam.  Less likely to be necrotizing fasciitis. Plan: Admit to ICU IV fluid resuscitation Broad-spectrum IV antibiotics Titrate norepinephrine Follow blood cultures Pain meds as needed Monitor  CBC, lactic acid Monitor urine output closely  Other Active Problems: Acute renal failure: Patient presented with a creatinine of 2.38.  Previous baseline is around 1-1.2.  Most recent creatinine was 1.07 on 06/08/2018.  Appears to be AKI secondary to severe sepsis, dehydration.  Will give IV fluid resuscitation and monitor input "renal function closely. Urinalysis is still pending and will need to be followed.  Lactic acidosis: Secondary to severe sepsis with shock as above.  Asthma: We will place on albuterol as needed  Gastroesophageal flux disease: Not on medications at home  Chronic back pain: Continue gabapentin, baclofen, oxycodone  Morbid obesity: Nutritional counseling   DVT prophylaxis:  Heparin Code Status:  Full code Family Communication: N/A  Disposition Plan: Admit to ICU for septic shock, will place on IV antibiotics and pressors.  Expect at least 4 to 5-day stay in the hospital. Consults called: N/A Admission status: Inpatient status   Olga CoasterSHARDUL M Basem Yannuzzi MD [facility]  If 7PM-7AM, please contact night-coverage   09/30/2019, 7:31 PM

## 2019-09-14 NOTE — ED Triage Notes (Signed)
Pt brought in by [facility] with c/o left lower leg pain, swelling, redness that started this morning. Pt has hx of cellulitis.

## 2019-09-14 NOTE — Progress Notes (Signed)
Pharmacy Antibiotic Note  Albert Klein is a 53 y.o. male admitted on Oct 02, 2019 with sepsis.  Pharmacy has been consulted for vanc/cefepime dosing.  Albert Klein is a morbidly obese male that presented with fevers and weakness after a fall yesterday. WBC is wnl. Vanc and cefepime have been ordered to r/o sepsis. He is also in AKI. We will use trough monitoring instead due to obesity.    Plan: Vanc 2g IV q24 Cefepime 2g IV q8 Vanc trough as needed  Height: 6\' 1"  (185.4 cm) Weight: (!) 600 lb (272.2 kg) IBW/kg (Calculated) : 79.9  Temp (24hrs), Avg:98.6 F (37 C), Min:98.6 F (37 C), Max:98.6 F (37 C)  Recent Labs  Lab 02-Oct-2019 1737  WBC 8.0  CREATININE 2.38*  LATICACIDVEN 6.4*    Estimated Creatinine Clearance: 80.5 mL/min (A) (by C-G formula based on SCr of 2.38 mg/dL (H)).    No Known Allergies  Antimicrobials this admission: 11/10 vanc>> 11/10 cefepime>>  Dose adjustments this admission:  Microbiology results:

## 2019-09-14 NOTE — ED Notes (Signed)
Attempted to obtain IV access on pt, unsuccessful. Contacted nurse to come with ultrasound to look for IV

## 2019-09-14 NOTE — ED Provider Notes (Signed)
[facility] EMERGENCY DEPARTMENT Provider Note   CSN: [id number] Arrival date & time: 09/16/2019  1538     History   Chief Complaint Chief Complaint  Patient presents with  . Leg Swelling    HPI Albert Klein is a 53 y.o. male.     HPI  This is a very ill-appearing 53 year old male who is morbidly obese, he weighs approximately 270 kg.  He reports having problems with polysubstance abuse, kidney stones, COPD and because of his lymphedema he gets recurrent cellulitis of his legs.  He reports that after accidentally urinating on his leg yesterday and then having a fall in the bathroom he started this morning with having some redness, pain, feeling subjective fevers and weakness.  He has been lightheaded and dizzy.  Presents by ambulance for these severe symptoms.  He has not had any medications prior to arrival.  Review of the medical record shows that the patient has been admitted to the hospital recurrently over the last 8 years for cellulitis of his leg most recently in August 2019.  Past Medical History:  Diagnosis Date  . Arthritis   . Asthma   . Back pain   . Borderline diabetes   . Cellulitis of left leg   . COPD (chronic obstructive pulmonary disease) (HCC)   . DJD (degenerative joint disease)   . Homeless   . Kidney stones   . Obesity   . Polysubstance abuse [facility])     Patient Active Problem List   Diagnosis Date Noted  . Cellulitis of left leg 06/07/2018  . Shingles outbreak 06/07/2018  . Shingles rash 06/07/2018  . Right hip pain 11/09/2013  . Folic acid deficiency 06/27/2011  . Iron deficiency anemia 06/27/2011  . Abnormal thyroid function test 06/27/2011  . Anemia 06/26/2011  . Cellulitis and abscess of leg 06/25/2011  . ARF (acute renal failure) (HCC) 06/25/2011  . Morbid obesity (HCC) 06/25/2011  . Asthma 06/25/2011  . GERD (gastroesophageal reflux disease) 06/25/2011  . Back pain 06/25/2011  . Polysubstance abuse (HCC) 06/25/2011    History  reviewed. No pertinent surgical history.      Home Medications    Prior to Admission medications   Medication Sig Start Date End Date Taking? Authorizing Provider  albuterol (PROVENTIL HFA;VENTOLIN HFA) 108 (90 BASE) MCG/ACT inhaler Inhale 2 puffs into the lungs every 6 (six) hours as needed for wheezing or shortness of breath.    [provider]  baclofen (LIORESAL) 10 MG tablet Take 10 mg by mouth 3 (three) times daily.    [provider]  gabapentin (NEURONTIN) 300 MG capsule Take 300 mg by mouth 4 (four) times daily.    [provider]  nabumetone (RELAFEN) 500 MG tablet Take 500 mg by mouth 2 (two) times daily.    [provider]  oxyCODONE (ROXICODONE) 15 MG immediate release tablet Take 15 mg by mouth 4 (four) times daily - after meals and at bedtime.    [provider]    Family History Family History  Problem Relation Age of Onset  . Cancer Mother   . Emphysema Mother   . Diabetes type II Mother   . Heart failure Mother   . Cancer Father   . Diabetes type II Father     Social History Social History   Tobacco Use  . Smoking status: Never Smoker  . Smokeless tobacco: Never Used  Substance Use Topics  . Alcohol use: No  . Drug use: Not Currently  Types: Marijuana    Comment: former     Allergies   Patient has no known allergies.   Review of Systems Review of Systems  All other systems reviewed and are negative.    Physical Exam Updated Vital Signs BP (!) 96/55 (BP Location: Left Arm)   Pulse (!) 114   Temp 98.6 F (37 C) (Oral)   Resp 20   Ht 1.854 m (6\' 1" )   Wt (!) 272.2 kg   SpO2 97%   BMI 79.16 kg/m   Physical Exam Vitals signs and nursing note reviewed.  Constitutional:      General: He is in acute distress.     Appearance: He is well-developed. He is ill-appearing.  HENT:     Head: Normocephalic and atraumatic.     Mouth/Throat:     Pharynx: No oropharyngeal exudate.  Eyes:      General: No scleral icterus.       Right eye: No discharge.        Left eye: No discharge.     Conjunctiva/sclera: Conjunctivae normal.     Pupils: Pupils are equal, round, and reactive to light.  Neck:     Musculoskeletal: Normal range of motion and neck supple.     Thyroid: No thyromegaly.     Vascular: No JVD.  Cardiovascular:     Rate and Rhythm: Regular rhythm. Tachycardia present.     Heart sounds: Normal heart sounds. No murmur. No friction rub. No gallop.   Pulmonary:     Effort: Pulmonary effort is normal. No respiratory distress.     Breath sounds: Normal breath sounds. No wheezing or rales.  Abdominal:     General: Bowel sounds are normal. There is no distension.     Palpations: Abdomen is soft. There is no mass.     Tenderness: There is no abdominal tenderness.     Comments: Morbidly obese but nontender abdomen  Musculoskeletal: Normal range of motion.        General: Swelling and tenderness present.     Right lower leg: Edema present.     Left lower leg: Edema present.     Comments: Severe lymphedema bilateral lower extremities  Lymphadenopathy:     Cervical: No cervical adenopathy.  Skin:    General: Skin is warm and dry.     Findings: Erythema and rash present.     Comments: Left lower extremity with erythema warmth and tenderness from the distal posterior thigh through the foot, no areas of fluctuance  Neurological:     Mental Status: He is alert.     Coordination: Coordination normal.     Comments: The patient is awake alert and following commands, moving all 4 extremities  Psychiatric:        Behavior: Behavior normal.      ED Treatments / Results  Labs (all labs ordered are listed, but only abnormal results are displayed) Labs Reviewed  LACTIC ACID, PLASMA - Abnormal; Notable for the following components:      Result Value   Lactic Acid, Venous 6.4 (*)    All other components within normal limits  COMPREHENSIVE METABOLIC PANEL - Abnormal; Notable  for the following components:   Sodium 133 (*)    CO2 20 (*)    BUN 23 (*)    Creatinine, Ser 2.38 (*)    Calcium 8.5 (*)    Total Protein 6.2 (*)    Albumin 3.2 (*)    GFR calc non Af Amer 30 (*)  GFR calc Af Amer 35 (*)    All other components within normal limits  CBC WITH DIFFERENTIAL/PLATELET - Abnormal; Notable for the following components:   nRBC 0.4 (*)    Lymphs Abs 0.2 (*)    All other components within normal limits  CULTURE, BLOOD (ROUTINE X 2)  CULTURE, BLOOD (ROUTINE X 2)  URINE CULTURE  SARS CORONAVIRUS 2 (TAT 6-24 HRS)  APTT  PROTIME-INR  LACTIC ACID, PLASMA  URINALYSIS, ROUTINE W REFLEX MICROSCOPIC    EKG EKG Interpretation  Date/Time:  Tuesday 09/20/2019 17:47:56 EST Ventricular Rate:  117 PR Interval:    QRS Duration: 121 QT Interval:  320 QTC Calculation: 447 R Axis:   -67 Text Interpretation: Sinus tachycardia Nonspecific IVCD with LAD Consider anterior infarct since last tracing no significant change Confirmed by Eber Hong ([id number]) on 09-20-2019 7:05:33 PM   Radiology Dg Chest Portable 1 View  Result Date: 2019/09/20 CLINICAL DATA:  Central line placement. EXAM: PORTABLE CHEST 1 VIEW COMPARISON:  Radiograph earlier this day. FINDINGS: Right internal jugular central venous catheter tip projects over the upper SVC. No visualized pneumothorax. Cardiomegaly is unchanged. No focal airspace disease. Degenerative change of the right shoulder. IMPRESSION: 1. Right internal jugular central venous catheter tip projects over the upper SVC. No pneumothorax. 2. Unchanged cardiomegaly. Electronically Signed   By: Narda Rutherford M.D.   On: 09-20-19 18:10   Dg Chest Port 1 View  Result Date: 09/20/2019 CLINICAL DATA:  Sepsis this. Left leg pain, redness and swelling. History of cellulitis. EXAM: PORTABLE CHEST 1 VIEW COMPARISON:  06/26/2011 FINDINGS: Mild cardiomegaly. Normal mediastinal contours. No evidence of focal airspace disease. No large  pleural effusion or pneumothorax. No pulmonary edema. Soft tissue attenuation from body habitus limits detailed assessment. IMPRESSION: Mild cardiomegaly. No evidence of congestive failure or pneumonia. Electronically Signed   By: Narda Rutherford M.D.   On: 09-20-2019 17:33    Procedures .Critical Care Performed by: Eber Hong, MD Authorized by: Eber Hong, MD   Critical care provider statement:    Critical care time (minutes):  75   Critical care time was exclusive of:  Separately billable procedures and treating other patients and teaching time   Critical care was necessary to treat or prevent imminent or life-threatening deterioration of the following conditions:  Sepsis and shock   Critical care was time spent personally by me on the following activities:  Blood draw for specimens, development of treatment plan with patient or surrogate, discussions with consultants, evaluation of patient's response to treatment, examination of patient, obtaining history from patient or surrogate, ordering and performing treatments and interventions, ordering and review of laboratory studies, ordering and review of radiographic studies, pulse oximetry, re-evaluation of patient's condition and review of old charts .Central Line  Date/Time: 20-Sep-2019 5:47 PM Performed by: Eber Hong, MD Authorized by: Eber Hong, MD   Consent:    Consent obtained:  Verbal   Consent given by:  Patient   Risks discussed:  Incorrect placement, nerve damage, pneumothorax, infection and bleeding   Alternatives discussed:  No treatment, delayed treatment and alternative treatment Universal protocol:    Imaging studies available: yes     Required blood products, implants, devices, and special equipment available: yes     Immediately prior to procedure, a time out was called: yes     Patient identity confirmed:  Verbally with patient Pre-procedure details:    Hand hygiene: Hand hygiene performed prior to  insertion     Skin preparation:  2% chlorhexidine   Skin preparation agent: Skin preparation agent completely dried prior to procedure   Anesthesia (see MAR for exact dosages):    Anesthesia method:  Local infiltration   Local anesthetic:  Lidocaine 1% w/o epi Procedure details:    Location:  R internal jugular   Patient position:  Reverse Trendelenburg   Procedural supplies:  Triple lumen   Catheter size:  7 Fr   Landmarks identified: yes     Ultrasound guidance: yes     Sterile ultrasound techniques: Sterile gel and sterile probe covers were used     Number of attempts:  1   Successful placement: yes   Post-procedure details:    Post-procedure:  Dressing applied and line sutured   Assessment:  Blood return through all ports, no pneumothorax on x-ray, free fluid flow and placement verified by x-ray   Patient tolerance of procedure:  Tolerated well, no immediate complications Comments:         (including critical care time)  Medications Ordered in ED Medications  cefTRIAXone (ROCEPHIN) 2 g in sodium chloride 0.9 % 100 mL IVPB (0 g Intravenous Stopped 07-13-2019 1847)  norepinephrine (LEVOPHED) 4mg  in 250mL premix infusion (6 mcg/min Intravenous Rate/Dose Change 07-13-2019 1851)  lactated ringers bolus 1,000 mL (1,000 mLs Intravenous New Bag/Given 07-13-2019 1737)    And  lactated ringers bolus 1,000 mL (1,000 mLs Intravenous New Bag/Given 07-13-2019 1816)    And  lactated ringers bolus 400 mL (400 mLs Intravenous New Bag/Given 07-13-2019 1846)  fentaNYL (SUBLIMAZE) injection 50 mcg (50 mcg Intramuscular Given 07-13-2019 1715)     Initial Impression / Assessment and Plan / ED Course  I have reviewed the triage vital signs and the nursing notes.  Pertinent labs & imaging results that were available during my care of the patient were reviewed by me and considered in my medical decision making (see chart for details).  Clinical Course as of Sep 13 1846  Tue Sep 14, 2019  1732 The  patient continued to be hypotensive and tachycardic, IV access was not able to be established due to his poor IV access and morbid obesity.  Due to the need for aggressive resuscitation with fluids antibiotics and possibly vasopressors I placed a central line.  This was placed on the patient's right internal jugular vein on the first attempt, x-ray was obtained to follow-up and confirm placement, breath sounds are normal without any shortness of breath afterwards   [BM]  1733 Only IV site was able to be obtained thus only 1 set of cultures was obtained, a second set of cultures was not readily available due to the patient's very poor access.  Due to not wanting to delay treatment antibiotics will be started immediately   [BM]  1745 The patient continues to have an ill appearance, he is now tachypneic, he has not had any improvement in his blood pressure, IV fluids are running, will start Levophed   [BM]  1800 Initial x-ray read is negative for acute findings, the second x-ray to confirm central line placement shows good placement of the central line without pneumothorax.   [BM]  1847 With the presence of a lactic acid over 6.4 the patient has been confirmed to be in septic shock.  There is also an acute kidney injury with a creatinine of 2.4 which is totally new from his baseline of 1.  Strangely the patient has no leukocytosis.  IV fluids ordered, will consult with hospitalist.  Currently getting Levophed   [  BM]    Clinical Course User Index [BM] Noemi Chapel, MD       Code sepsis will be activated, this patient has a skin and soft tissue infection, he has measuring a blood pressure of 96/55 with a pulse of 114.  I suspect that he is in severe sepsis or possibly shock, IV fluids will be initiated, 2 large-bore IVs and antibiotics.  We will use ideal body weight 2 doses IV fluids Given his weight.  Antibiotics will be ordered.  He will need to be admitted to a high level of care.  The patient is  critically ill  I discussed care with the hospitalist who has requested that care be given to the oncoming physician in 10 minutes,  The patient has an improved blood pressure on Levophed and IV fluids.  Lactic acid greater than 6  Vitals:   09/19/2019 1845 09/12/2019 1846 09/27/2019 1850 09/07/2019 1852  BP:   (!) 102/54   Pulse:      Resp: (!) 21 (!) 40 18 (!) 37  Temp:      TempSrc:      SpO2: 95% 95% 94%   Weight:      Height:       CHRISTROPHER GINTZ was evaluated in Emergency Department on 09/07/2019 for the symptoms described in the history of present illness. He was evaluated in the context of the global COVID-19 pandemic, which necessitated consideration that the patient might be at risk for infection with the SARS-CoV-2 virus that causes COVID-19. Institutional protocols and algorithms that pertain to the evaluation of patients at risk for COVID-19 are in a state of rapid change based on information released by regulatory bodies including the CDC and federal and state organizations. These policies and algorithms were followed during the patient's care in the ED.  D/w hospitalist at 7:15 - they will admit to ICU Xray to r/o nec fas   Final Clinical Impressions(s) / ED Diagnoses   Final diagnoses:  Septic shock ([place])  Cellulitis of left lower extremity  Acute renal failure, unspecified acute renal failure type [facility])      Noemi Chapel, MD 09/20/2019 1914

## 2019-09-15 ENCOUNTER — Inpatient Hospital Stay (HOSPITAL_COMMUNITY): Payer: Medicaid Other

## 2019-09-15 ENCOUNTER — Encounter (HOSPITAL_COMMUNITY): Payer: Self-pay | Admitting: *Deleted

## 2019-09-15 DIAGNOSIS — L03119 Cellulitis of unspecified part of limb: Secondary | ICD-10-CM | POA: Diagnosis not present

## 2019-09-15 DIAGNOSIS — K219 Gastro-esophageal reflux disease without esophagitis: Secondary | ICD-10-CM | POA: Diagnosis not present

## 2019-09-15 DIAGNOSIS — R6521 Severe sepsis with septic shock: Secondary | ICD-10-CM

## 2019-09-15 DIAGNOSIS — N179 Acute kidney failure, unspecified: Secondary | ICD-10-CM

## 2019-09-15 DIAGNOSIS — A419 Sepsis, unspecified organism: Principal | ICD-10-CM | POA: Diagnosis present

## 2019-09-15 DIAGNOSIS — L02419 Cutaneous abscess of limb, unspecified: Secondary | ICD-10-CM

## 2019-09-15 DIAGNOSIS — F1910 Other psychoactive substance abuse, uncomplicated: Secondary | ICD-10-CM

## 2019-09-15 LAB — RAPID URINE DRUG SCREEN, HOSP PERFORMED
Amphetamines: NOT DETECTED
Barbiturates: NOT DETECTED
Benzodiazepines: NOT DETECTED
Cocaine: NOT DETECTED
Opiates: POSITIVE — AB
Tetrahydrocannabinol: POSITIVE — AB

## 2019-09-15 LAB — CBC
HCT: 43.1 % (ref 39.0–52.0)
Hemoglobin: 13.4 g/dL (ref 13.0–17.0)
MCH: 29.5 pg (ref 26.0–34.0)
MCHC: 31.1 g/dL (ref 30.0–36.0)
MCV: 94.9 fL (ref 80.0–100.0)
Platelets: 185 10*3/uL (ref 150–400)
RBC: 4.54 MIL/uL (ref 4.22–5.81)
RDW: 14.4 % (ref 11.5–15.5)
WBC: 11.5 10*3/uL — ABNORMAL HIGH (ref 4.0–10.5)
nRBC: 0.2 % (ref 0.0–0.2)

## 2019-09-15 LAB — URINALYSIS, ROUTINE W REFLEX MICROSCOPIC
Bilirubin Urine: NEGATIVE
Glucose, UA: NEGATIVE mg/dL
Ketones, ur: NEGATIVE mg/dL
Leukocytes,Ua: NEGATIVE
Nitrite: NEGATIVE
Protein, ur: 30 mg/dL — AB
Specific Gravity, Urine: 1.015 (ref 1.005–1.030)
pH: 5 (ref 5.0–8.0)

## 2019-09-15 LAB — BASIC METABOLIC PANEL
Anion gap: 9 (ref 5–15)
BUN: 29 mg/dL — ABNORMAL HIGH (ref 6–20)
CO2: 22 mmol/L (ref 22–32)
Calcium: 8 mg/dL — ABNORMAL LOW (ref 8.9–10.3)
Chloride: 103 mmol/L (ref 98–111)
Creatinine, Ser: 2.61 mg/dL — ABNORMAL HIGH (ref 0.61–1.24)
GFR calc Af Amer: 31 mL/min — ABNORMAL LOW (ref >60)
GFR calc non Af Amer: 27 mL/min — ABNORMAL LOW (ref >60)
Glucose, Bld: 75 mg/dL (ref 70–99)
Potassium: 3.9 mmol/L (ref 3.5–5.1)
Sodium: 134 mmol/L — ABNORMAL LOW (ref 135–145)

## 2019-09-15 LAB — LACTIC ACID, PLASMA
Lactic Acid, Venous: 4.4 mmol/L (ref 0.5–1.9)
Lactic Acid, Venous: 4.3 mmol/L (ref 0.5–1.9)
Lactic Acid, Venous: 6.2 mmol/L (ref 0.5–1.9)
Lactic Acid, Venous: 5.4 mmol/L (ref 0.5–1.9)

## 2019-09-15 LAB — MRSA PCR SCREENING
MRSA by PCR: POSITIVE — AB
MRSA by PCR: POSITIVE — AB

## 2019-09-15 LAB — SARS CORONAVIRUS 2 (TAT 6-24 HRS): SARS Coronavirus 2: NEGATIVE

## 2019-09-15 LAB — HIV ANTIBODY (ROUTINE TESTING W REFLEX): HIV Screen 4th Generation wRfx: NONREACTIVE

## 2019-09-15 LAB — C-REACTIVE PROTEIN: CRP: 22.5 mg/dL — ABNORMAL HIGH (ref <1.0)

## 2019-09-15 MED ORDER — PANTOPRAZOLE SODIUM 40 MG IV SOLR
40.0000 mg | Freq: Every day | INTRAVENOUS | Status: DC
  Administered 2019-09-15 – 2019-09-16 (×2): 40 mg via INTRAVENOUS
  Filled 2019-09-15 (×2): qty 40

## 2019-09-15 MED ORDER — VASOPRESSIN 20 UNIT/ML IV SOLN
INTRAVENOUS | Status: AC
  Filled 2019-09-15: qty 2

## 2019-09-15 MED ORDER — ORAL CARE MOUTH RINSE
15.0000 mL | Freq: Two times a day (BID) | OROMUCOSAL | Status: DC
  Administered 2019-09-16 (×2): 15 mL via OROMUCOSAL

## 2019-09-15 MED ORDER — FENTANYL CITRATE (PF) 100 MCG/2ML IJ SOLN
25.0000 ug | INTRAMUSCULAR | Status: DC | PRN
  Administered 2019-09-15: 50 ug via INTRAVENOUS
  Filled 2019-09-15: qty 2

## 2019-09-15 MED ORDER — HALOPERIDOL LACTATE 5 MG/ML IJ SOLN
5.0000 mg | Freq: Once | INTRAMUSCULAR | Status: AC
  Administered 2019-09-15: 22:00:00 5 mg via INTRAVENOUS
  Filled 2019-09-15: qty 1

## 2019-09-15 MED ORDER — NOREPINEPHRINE 16 MG/250ML-% IV SOLN
0.0000 ug/min | INTRAVENOUS | Status: DC
  Administered 2019-09-15: 08:00:00 40 ug/min via INTRAVENOUS
  Administered 2019-09-15: 16:00:00 38 ug/min via INTRAVENOUS
  Filled 2019-09-15 (×6): qty 250

## 2019-09-15 MED ORDER — SODIUM CHLORIDE 0.9 % IV BOLUS (SEPSIS)
1000.0000 mL | Freq: Once | INTRAVENOUS | Status: AC
  Administered 2019-09-15: 1000 mL via INTRAVENOUS

## 2019-09-15 MED ORDER — LACTATED RINGERS IV BOLUS
1000.0000 mL | Freq: Once | INTRAVENOUS | Status: AC
  Administered 2019-09-15: 04:00:00 1000 mL via INTRAVENOUS

## 2019-09-15 MED ORDER — VASOPRESSIN 20 UNIT/ML IV SOLN
0.0300 [IU]/min | INTRAVENOUS | Status: DC
  Administered 2019-09-15: 03:00:00 0.03 [IU]/min via INTRAVENOUS
  Filled 2019-09-15 (×4): qty 2

## 2019-09-15 MED ORDER — SODIUM CHLORIDE 0.9 % IV BOLUS (SEPSIS)
1000.0000 mL | Freq: Once | INTRAVENOUS | Status: AC
  Administered 2019-09-15: 14:00:00 1000 mL via INTRAVENOUS

## 2019-09-15 MED ORDER — PNEUMOCOCCAL VAC POLYVALENT 25 MCG/0.5ML IJ INJ: 0.5000 mL | INJECTION | INTRAMUSCULAR | Status: DC

## 2019-09-15 MED ORDER — GABAPENTIN 100 MG PO CAPS
100.0000 mg | ORAL_CAPSULE | Freq: Three times a day (TID) | ORAL | Status: DC
  Administered 2019-09-15 (×2): 100 mg via ORAL
  Filled 2019-09-15 (×3): qty 1

## 2019-09-15 MED ORDER — SODIUM CHLORIDE 0.9 % IV SOLN
INTRAVENOUS | Status: DC
  Administered 2019-09-15 – 2019-09-16 (×2): via INTRAVENOUS

## 2019-09-15 MED ORDER — CLINDAMYCIN PHOSPHATE 600 MG/50ML IV SOLN
600.0000 mg | Freq: Once | INTRAVENOUS | Status: AC
  Administered 2019-09-15: 04:00:00 600 mg via INTRAVENOUS
  Filled 2019-09-15: qty 50

## 2019-09-15 MED ORDER — SODIUM CHLORIDE 0.9 % IV BOLUS
1000.0000 mL | Freq: Once | INTRAVENOUS | Status: AC
  Administered 2019-09-15: 1000 mL via INTRAVENOUS

## 2019-09-15 MED ORDER — SODIUM CHLORIDE 0.9 % IV BOLUS (SEPSIS)
400.0000 mL | Freq: Once | INTRAVENOUS | Status: AC
  Administered 2019-09-15: 400 mL via INTRAVENOUS

## 2019-09-15 MED ORDER — CHLORHEXIDINE GLUCONATE CLOTH 2 % EX PADS
6.0000 | MEDICATED_PAD | Freq: Every day | CUTANEOUS | Status: DC
  Administered 2019-09-15: 09:00:00 6 via TOPICAL

## 2019-09-15 MED ORDER — SODIUM CHLORIDE 0.9 % IV SOLN: INTRAVENOUS | Status: DC | PRN

## 2019-09-15 MED ORDER — HALOPERIDOL LACTATE 5 MG/ML IJ SOLN
5.0000 mg | Freq: Four times a day (QID) | INTRAMUSCULAR | Status: DC | PRN
  Administered 2019-09-15 (×2): 5 mg via INTRAVENOUS
  Filled 2019-09-15 (×2): qty 1

## 2019-09-15 NOTE — Progress Notes (Signed)
Levophed increased to 37.5mg /hr

## 2019-09-15 NOTE — Procedures (Addendum)
Arterial Catheter Insertion Procedure Note Albert Klein [id number] [birth year]  Procedure: Insertion of Arterial Catheter  Indications: Blood pressure monitoring  Procedure Details Consent: Risks of procedure as well as the alternatives and risks of each were explained to the (patient/caregiver).  Consent for procedure obtained. Time Out: Verified patient identification, verified procedure, site/side was marked, verified correct patient position, special equipment/implants available, medications/allergies/relevent history reviewed, required imaging and test results available.  Performed  Maximum sterile technique was used including antiseptics, cap, gloves, gown, hand hygiene, mask and sheet. Skin prep: Chlorhexidine; local anesthetic administered 20 gauge catheter was inserted into left radial artery using the Seldinger technique. ULTRASOUND GUIDANCE USED: YES Evaluation Blood flow good; BP tracing good. Complications: No apparent complications.  Observed and assisted with insertion at bedside Dr Laurelyn Sickle MD   Georgann Housekeeper, AGACNP-BC [place] Pager [phone] or [phone]  09/15/2019 9:27 PM

## 2019-09-15 NOTE — Progress Notes (Signed)
[facility] Progress Note Patient Name: Albert Klein DOB: 07-Feb-1966 MRN: [id number]   Date of Service  09/15/2019  HPI/Events of Note  Hypotension by non-invasive cuff likely not accurate reflection of patient's BP as he is interacting purposefully and he is morbidly obese, making  NIBP challenging to obtain.  eICU Interventions  Arterial line ordered for better monitoring of blood pressure. Plan is to wean pressor if arterial line indicates that pressure is much higher. Renal ultrasound which was previously ordered at [facility] reordered for [facility].        Frederik Pear 09/15/2019, 8:44 PM

## 2019-09-15 NOTE — Progress Notes (Signed)
PROGRESS NOTE [facility]   Albert Klein  ZOX:[id number]  DOB: February 24, 1966  DOA: Oct 23, 202020 PCP: Oval Linseyondiego, Richard, MD   Brief AdmArville Goission Hx: 53 y/o male with severe morbid obesity, opioid dependence, recurrent LE cellulitis, asthma, DJD, chronic back pain presented to ED by EMS with severe sepsis, left leg cellulitis.    MDM/Assessment & Plan:   1. Severe Sepsis with septic shock - presumably secondary to LE edema given exam findings.  He remains very septic, Lactic acid remains elevated at 6.  I have ordered additional fluid boluses.  He remains on 2 pressors for support. He is on levophed and vasopression.  Continue broad spectrum antibiotics.  I have spoken with ICU attending Dr. Audrie LiaP. Clark who has accepted patient in [facility].  I spoke with [facility] and [facility].   2. AKI - Pt is severely dehydrated and is being bolused IV fluids.  No improvement in creatinine.  Continue to follow closely.  Renal US ordered but has not been able to be done at this time.  3. Lactic acidosis - secondary to sepsis - continue IV fluid hydration.  4. GERD - protonix ordered for GI protection.  5. Chronic back pain / opioid dependence - UDS positive for THC and opioid only.  6. Morbid Obesity - pt needs gastric bypass surgery.    DVT prophylaxis: heparin  Code Status: full  Family Communication: patient updated bedside, verbalized understanding Disposition Plan: transfer [facility] ICU at [facility]    Consultants:    Procedures:    Antimicrobials: Cefepime 11/10 >> Vancomycin 11/10 >>  Subjective: Pt vocalizing but remains agitated and constantly pulling at lines  Objective: Vitals:   09/15/19 0845 09/15/19 0900 09/15/19 0915 09/15/19 1030  BP: (!) 161/132 (!) 112/98 (!) 81/66 95/79  Pulse: (!) 111 (!) 109  (!) 108  Resp: 16  18 (!) 26  Temp:      TempSrc:      SpO2: (!) 82% 93%  92%  Weight:      Height:        Intake/Output Summary (Last 24  hours) at 09/15/2019 1233 Last data filed at 09/15/2019 0538 Gross per 24 hour  Intake 933.61 ml  Output -  Net 933.61 ml   Filed Weights   Aug 27, 2019 1547  Weight: (!) 272.2 kg   REVIEW OF SYSTEMS  As per history otherwise all reviewed and reported negative  Exam:  General exam: morbidly obese male, lying in bed, can't keep still, agitated at times. Able to answer questions.  Respiratory system: No increased work of breathing. Cardiovascular system: tachycardic, S1 & S2 heard. No JVD, murmurs, gallops, clicks or pedal edema. Gastrointestinal system: Abdomen is nondistended, soft and nontender. Normal bowel sounds heard. Central nervous system: Alert and oriented. No focal neurological deficits. Extremities: left leg with redness and erythema and signs of cellulitis  Data Reviewed: Basic Metabolic Panel: Recent Labs  Lab Aug 27, 2019 1737 09/15/19 0138  NA 133* 134*  K 3.8 3.9  CL 99 103  CO2 20* 22  GLUCOSE 85 75  BUN 23* 29*  CREATININE 2.38* 2.61*  CALCIUM 8.5* 8.0*   Liver Function Tests: Recent Labs  Lab Aug 27, 2019 1737  AST 41  ALT 21  ALKPHOS 51  BILITOT 1.2  PROT 6.2*  ALBUMIN 3.2*   No results for input(s): LIPASE, AMYLASE in the last 168 hours. No results for input(s): AMMONIA in the last 168 hours. CBC: Recent Labs  Lab  09/15/2019 1737 09/15/19 0138  WBC 8.0 11.5*  NEUTROABS 7.5  --   HGB 13.5 13.4  HCT 43.3 43.1  MCV 95.2 94.9  PLT 196 185   Cardiac Enzymes: Recent Labs  Lab 09/13/2019 1737  CKTOTAL 536*   CBG (last 3)  No results for input(s): GLUCAP in the last 72 hours. Recent Results (from the past 240 hour(s))  Blood Culture (routine x 2)     Status: None (Preliminary result)   Collection Time: 10/04/2019  5:37 PM   Specimen: BLOOD  Result Value Ref Range Status   Specimen Description BLOOD CENTRAL LINE  Final   Special Requests   Final    BOTTLES DRAWN AEROBIC AND ANAEROBIC Blood Culture adequate volume   Culture   Final    NO  GROWTH < 24 HOURS Performed at [facility], [address] [place], [place] [address]    Report Status PENDING  Incomplete  Blood Culture (routine x 2)     Status: None (Preliminary result)   Collection Time: 09/27/2019  5:56 PM   Specimen: BLOOD  Result Value Ref Range Status   Specimen Description BLOOD NECK LINE  Final   Special Requests   Final    BOTTLES DRAWN AEROBIC AND ANAEROBIC Blood Culture adequate volume   Culture   Final    NO GROWTH < 24 HOURS Performed at [facility], [address] [place], [place] [address]    Report Status PENDING  Incomplete     Studies: Dg Tibia/fibula Left  Result Date: 09/12/2019 CLINICAL DATA:  Left leg pain, no known injury, initial encounter EXAM: LEFT TIBIA AND FIBULA - 2 VIEW COMPARISON:  None. FINDINGS: Generalized soft tissue swelling is noted within the left lower leg. No acute fracture or dislocation is noted. Degenerative changes of the knee joint and ankle joint are seen. IMPRESSION: Generalized soft tissue swelling without acute bony abnormality. Electronically Signed   By: Inez Catalina M.D.   On: 09/26/2019 21:09   Dg Chest Portable 1 View  Result Date: 10/01/2019 CLINICAL DATA:  Central line placement. EXAM: PORTABLE CHEST 1 VIEW COMPARISON:  Radiograph earlier this day. FINDINGS: Right internal jugular central venous catheter tip projects over the upper SVC. No visualized pneumothorax. Cardiomegaly is unchanged. No focal airspace disease. Degenerative change of the right shoulder. IMPRESSION: 1. Right internal jugular central venous catheter tip projects over the upper SVC. No pneumothorax. 2. Unchanged cardiomegaly. Electronically Signed   By: Keith Rake M.D.   On: 09/18/2019 18:10   Dg Chest Port 1 View  Result Date: 10/03/2019 CLINICAL DATA:  Sepsis this. Left leg pain, redness and swelling. History of cellulitis. EXAM: PORTABLE CHEST 1 VIEW COMPARISON:  06/26/2011 FINDINGS: Mild cardiomegaly. Normal mediastinal contours.  No evidence of focal airspace disease. No large pleural effusion or pneumothorax. No pulmonary edema. Soft tissue attenuation from body habitus limits detailed assessment. IMPRESSION: Mild cardiomegaly. No evidence of congestive failure or pneumonia. Electronically Signed   By: Keith Rake M.D.   On: 09/15/2019 17:33     Scheduled Meds: . baclofen  5 mg Oral TID  . Chlorhexidine Gluconate Cloth  6 each Topical Daily  . gabapentin  100 mg Oral TID  . heparin  5,000 Units Subcutaneous Q8H  . oxyCODONE  15 mg Oral TID PC & HS  . [START ON 09/16/2019] pneumococcal 23 valent vaccine  0.5 mL Intramuscular Tomorrow-1000  . sodium chloride flush  3 mL Intravenous Q12H   Continuous Infusions: . sodium chloride    .  sodium chloride    . sodium chloride 150 mL/hr at 09/15/19 1215  . ceFEPime (MAXIPIME) IV 2 g (09/15/19 0647)  . norepinephrine (LEVOPHED) Adult infusion 38 mcg/min (09/15/19 0900)  . sodium chloride     And  . sodium chloride     And  . sodium chloride    . vancomycin    . vasopressin (PITRESSIN) infusion - *FOR SHOCK* Stopped (09/15/19 1000)    Active Problems:   Cellulitis and abscess of leg   ARF (acute renal failure) (HCC)   Morbid obesity (HCC)   Asthma   GERD (gastroesophageal reflux disease)   Polysubstance abuse (HCC)   Anemia  Critical Care Procedure Note Authorized and Performed by: Maryln Manuel MD  Total Critical Care time:  45 minutes Due to a high probability of clinically significant, life threatening deterioration, the patient required my highest level of preparedness to intervene emergently and I personally spent this critical care time directly and personally managing the patient.  This critical care time included obtaining a history; examining the patient, pulse oximetry; ordering and review of studies; arranging urgent treatment with development of a management plan; evaluation of patient's response of treatment; frequent reassessment; and discussions  with other providers.  This critical care time was performed to assess and manage the high probability of imminent and life threatening deterioration that could result in multi-organ failure.  It was exclusive of separately billable procedures and treating other patients and teaching time.    Standley Dakins, MD [facility] 09/15/2019, 12:33 PM    LOS: 1 day  How to contact the [facility] Attending or Consulting provider 7A - 7P or covering provider during after hours 7P -7A, for this patient?  1. Check the care team in [employer] and look for a) attending/consulting TRH provider listed and b) the [facility] team listed 2. Log into [email or web address] and use [place]'s universal password to access. If you do not have the password, please contact the hospital operator. 3. Locate the [facility] provider you are looking for under [facility] and page to a number that you can be directly reached. 4. If you still have difficulty reaching the provider, please page the [facility] (Director on Call) for the Hospitalists listed on amion for assistance.

## 2019-09-15 NOTE — Progress Notes (Signed)
CCM attempted a-line 3x w/ no success.  Will relay to night shift.

## 2019-09-15 NOTE — Progress Notes (Signed)
CRITICAL VALUE ALERT  Critical Value:  MRSA Positive and Positive Blood Cultures  Date & Time Notied:  09/15/2019 at 1405  Provider Notified: Dr. Wynetta Emery

## 2019-09-15 NOTE — Progress Notes (Signed)
Attempted aline x2 L radial, unsuccessful.   Patient tolerated well.  x1 R radial, unsuccessful.  Julious Oka, RRT assisting.

## 2019-09-15 NOTE — Progress Notes (Signed)
CRITICAL VALUE ALERT  Critical Value:  Lactic Acid 5.4  Date & Time Notied:  09/15/2019 at 1410  Provider Notified: Dr. Wynetta Emery

## 2019-09-15 NOTE — Progress Notes (Signed)
Mitts applied to both hands as patient pulling at tubes. Redirects only for short periods of time.

## 2019-09-15 NOTE — H&P (Addendum)
NAME:  Albert Klein, MRN:  [id number], DOB:  Mar 22, 1966, LOS: 1 ADMISSION DATE:  2019/10/05, CONSULTATION DATE:  09/15/2019 REFERRING MD:  Laural Benes, [facility], CHIEF COMPLAINT:  Shock  Brief History   53 yo M transferring from [facility] to [facility] with shock, likely septic shock in setting of suspected LLE cellulitis.   History of present illness   53 yo M PMH morbid obesity, recurrent lower extremity cellulitis, lymphedema, substance use disorder who presented to [facility] 11/10 with CC LLE pain and edema after patient slipped in restroom after urinating on leg and sustained fall. Patient endorsed associated erythema, fevers, dizziness, lightheadedness. He endorsed difficulty with ambulation. Endorses loose stools but is unsure regarding chronicity. Denies blood in stool or hematemesis. Denied n/v/abdominal pain, chest pain, SOB, dysuria.  In [facility], Temp 98.6, HR 118, RR 37, BP 102/54, SpO2 94% on RA.  Lactic acid 6.4 WBC 8.0 H/H 13.5/43 plt `96 Na 133, K 3.8 Bicarb 20 BUN/Cr 23/2.38 CVC placed in [facility] due to difficult access. Admitted to hospitalist team with severe sepsis due to LLE cellulitis. He was started on broad spectrum abx, started on NE and given IVF.   11/11 patient with worsening hemodynamics and escalating NE requirement. Vasopressin added. Clindamycin added. Decision is made to pursue transfer to [facility] ICU, with PCCM to be primary team.   Past Medical History  Lymphedema Recurrent cellulitis Morbid obesity Asthma GERD Substance use disorder Degenerative joint disease  Chronic back pain  Significant Hospital Events   11/10 admitted to [facility] ICU. Started on vanc cefepime. Given IVF and started on NE 11/11 Increasing NE requirement. Vaso added. Further volume resuscitation. Clindamycin added. Will transfer to [facility] ICU   Consults:  PCCM  Procedures:  CVC 11/10>>>   Significant Diagnostic Tests:  L tib/fib XR> generalized soft tissue swelling, no acute fx, no dislocation. Degenerative joint  changes of knee and ankle.   Micro Data:  11/11 MRSA> pos 11/11 UCx>>> 11/10 BCx >GPC 11/10 SARS Cov2> negative   Antimicrobials:  11/10 vanc>> 11/10 cefepime>> 11/11 clindamycin>>   Interim history/subjective:  Transferring from [facility] ICU to [facility] ICU due to worsening shock   Arrives on RA and in NAD   Objective   Blood pressure 95/79, pulse (!) 108, temperature 98.1 F (36.7 C), temperature source Oral, resp. rate (!) 26, height 6\' 1"  (1.854 m), weight (!) 272.2 kg, SpO2 92 %.        Intake/Output Summary (Last 24 hours) at 09/15/2019 1235 Last data filed at 09/15/2019 0538 Gross per 24 hour  Intake 933.61 ml  Output -  Net 933.61 ml   Filed Weights   Oct 05, 2019 1547  Weight: (!) 272.2 kg    Examination: General: obese adult, supine on stretcher NAD HENT: NCAT. Pink mmm redundant neck tissue. Lungs: Diminished bibasilar sounds. Symmetrical chest expansion. No accessory muscle use on [facility] Cardiovascular: Sinus tachycardia. Distant heart sounds. Cap refill < 3 seconds  Abdomen: Obese soft round ndnt  Extremities: BLE lymphedema. LLE erythema extending from ankle to mid-thigh.  Neuro: Awake, drowsy, Ox3. Following commands. Globally weak but no focal weakness. PERRL 37mm  GU: defer  Resolved Hospital Problem list     Assessment & Plan:   Shock  -likely septic shock in setting of  1)recurring cellulitis, in this instance specifically LLE cellulitis. No subcutaneous air on LLE film 2) GPC in blood culture -Also possible that there is component of intravascular hypovolemia which could be contributing to shock as well.  Elevated Lactic Acid P -  Transfer to [facility] ICU -follow up micro data -MAP goal > 65 -Continue Vaso, NE; titrate NE for goal  -Continue abx -ECHO  -trend lactic acid PRN  -NPO  Hypoxia P -[place] for SpO2 goal > 92% -Elevate HOB -NPO   Acute encephalopathy -suspect in setting of septic shock  P -Minimize sedating medications as able -Delirium  precautions   AKI -likely pre-renal in setting of shock P -follow up Renal US -send FENa  -renally dosed meds; appreciate pharmacy assistance   Chronic back pain  -home gabapentin, baclofen, oxycodone P -Will order PRN fentanyl and home gabapentin   Polysubstance use disorder -UDS with opioids and THC only P -when appropriate, substance use counseling   Diarrhea  P -patient with significant pain during bathing and turning. Although do not favor invasive measures in this instance it is appropriate for insertion of rectal tube to reduce patient pain and anxiety with cleaning given frequency  -GI panel   Best practice:  Diet: NPO Pain/Anxiety/Delirium protocol (if indicated): na VAP protocol (if indicated): na DVT prophylaxis: subQ heparin GI prophylaxis: protonix  Glucose control: monitor Mobility: BR Code Status: Full  Family Communication: Pending Disposition: [facility] ICU    Labs   CBC: Recent Labs  Lab 09/16/2019 1737 09/15/19 0138  WBC 8.0 11.5*  NEUTROABS 7.5  --   HGB 13.5 13.4  HCT 43.3 43.1  MCV 95.2 94.9  PLT 196 106    Basic Metabolic Panel: Recent Labs  Lab 09/08/2019 1737 09/15/19 0138  NA 133* 134*  K 3.8 3.9  CL 99 103  CO2 20* 22  GLUCOSE 85 75  BUN 23* 29*  CREATININE 2.38* 2.61*  CALCIUM 8.5* 8.0*   GFR: Estimated Creatinine Clearance: 73.4 mL/min (A) (by C-G formula based on SCr of 2.61 mg/dL (H)). Recent Labs  Lab 09/26/2019 1737  09/16/2019 2210 09/15/19 0138 09/15/19 0637 09/15/19 1019  WBC 8.0  --   --  11.5*  --   --   LATICACIDVEN 6.4*   < > 6.0* 4.4* 4.3* 6.2*   < > = values in this interval not displayed.    Liver Function Tests: Recent Labs  Lab 09/16/2019 1737  AST 41  ALT 21  ALKPHOS 51  BILITOT 1.2  PROT 6.2*  ALBUMIN 3.2*   No results for input(s): LIPASE, AMYLASE in the last 168 hours. No results for input(s): AMMONIA in the last 168 hours.  ABG    Component Value Date/Time   PHART 7.460 (H) 02/12/2008 1400    PCO2ART 33.9 (L) 02/12/2008 1400   PO2ART 70.3 (L) 02/12/2008 1400   HCO3 23.8 02/12/2008 1400   TCO2 20.7 02/12/2008 1400   O2SAT 95.2 02/12/2008 1400     Coagulation Profile: Recent Labs  Lab 09/08/2019 1737  INR 1.2    Cardiac Enzymes: Recent Labs  Lab 09/16/2019 1737  CKTOTAL 536*    HbA1C: No results found for: HGBA1C  CBG: No results for input(s): GLUCAP in the last 168 hours.  Review of Systems:   As per HPI   Past Medical History  He,  has a past medical history of Arthritis, Asthma, Back pain, Borderline diabetes, Cellulitis of left leg, COPD (chronic obstructive pulmonary disease) ([place]), DJD (degenerative joint disease), Homeless, Kidney stones, Obesity, and Polysubstance abuse ([place]).   Surgical History   History reviewed. No pertinent surgical history.   Social History   reports that he has never smoked. He has never used smokeless tobacco. He reports previous drug use. Drug:  Marijuana. He reports that he does not drink alcohol.   Family History   His family history includes Cancer in his father and mother; Diabetes type II in his father and mother; Emphysema in his mother; Heart failure in his mother.   Allergies No Known Allergies   Home Medications  Prior to Admission medications   Medication Sig Start Date End Date Taking? Authorizing Provider  albuterol (PROVENTIL HFA;VENTOLIN HFA) 108 (90 BASE) MCG/ACT inhaler Inhale 2 puffs into the lungs every 6 (six) hours as needed for wheezing or shortness of breath.   Yes [provider]  baclofen (LIORESAL) 10 MG tablet Take 10 mg by mouth 3 (three) times daily.   Yes [provider]  gabapentin (NEURONTIN) 400 MG capsule Take 400 mg by mouth 4 (four) times daily. 09/08/19  Yes [provider]  naproxen (NAPROSYN) 500 MG tablet Take 500 mg by mouth 2 (two) times daily. 08/10/19  Yes [provider]  oxyCODONE (ROXICODONE) 15 MG immediate release tablet Take 15 mg by mouth 4  (four) times daily - after meals and at bedtime.   Yes [provider]  nabumetone (RELAFEN) 500 MG tablet Take 500 mg by mouth 2 (two) times daily.    [provider]     Critical care time: 45 min      Tessie FassGrace Bowser MSN, AGACNP-BC [facility] [phone] If no answer, [phone] 09/15/2019, 12:35 PM  Attending Note:  53 year old male with PMH of super morbid obesity with cellulitis of the left leg.  No further events since arrival.  On exam, alert and interactive with distant BS and left leg cellulitis.  On exam, he is alert and interactive, on high dose pressors.  I reviewed CXR myself, no acute disease noted.  Discussed with PCCM-NP.  Will use pressors for BP support.  Failed getting an a-line for accurate BP measurement but feel this is inaccurate measurement.  Broad spectrum abx.  If worsens in AM may need surgical interventions.  Pressors as ordered.  IVF resuscitation.  PCCM will admit.  The patient is critically ill with multiple organ systems failure and requires high complexity decision making for assessment and support, frequent evaluation and titration of therapies, application of advanced monitoring technologies and extensive interpretation of multiple databases.   Critical Care Time devoted to patient care services described in this note is  40  Minutes. This time reflects time of care of this signee Dr Koren BoundWesam Yacoub. This critical care time does not reflect procedure time, or teaching time or supervisory time of PA/NP/Med student/Med Resident etc but could involve care discussion time.  Alyson ReedyWesam G. Yacoub, M.D. [facility].

## 2019-09-15 NOTE — Progress Notes (Signed)
With assist of [facility]link and ICU staff, patient assisted to stretcher. IVF and medication infusing through R IJ TLC. Report called to charge nurse at [facility]

## 2019-09-15 NOTE — Progress Notes (Signed)
[facility] Progress Note Patient Name: Albert Klein DOB: Oct 01, 1966 MRN: [id number]   Date of Service  09/15/2019  HPI/Events of Note  RN requesting sedation for patient but e is at very high risk for respiratory deterioration, and his blood pressure is marginal.  eICU Interventions  No intervention  For now.        Kerry Kass Asna Muldrow 09/15/2019, 11:54 PM

## 2019-09-15 NOTE — Progress Notes (Signed)
Attempted Art line insertion. 2 therapist attempted 2x each with no success. RN made aware.

## 2019-09-15 NOTE — ED Provider Notes (Signed)
Patient is already admitted and hospitalist has taken over care. I was asked to see him by nursing 2/2 maxing out two pressors and still with soft pressures.  On exam, he appears ill. He is diaphoretic. Tachycardic. Erythema to left leg. MAP around 65 however every time he turns his head to the right it seems to occlude his central line. He is asking for help to urinate. Review of records show improving lactic acid after levophed and Multiple liters of fluids. No h/o chronic steroid use. Already had vanc/cefepime Possibly nec fasc? Add on CRP/clindamycin, more fluids.  Currently protecting airway, discussed with him trying to keep head to left to avoid occluding central line.  hospitalist updated.    Darcie Mellone, Corene Cornea, MD 09/24/2019 [phone]

## 2019-09-16 ENCOUNTER — Inpatient Hospital Stay (HOSPITAL_COMMUNITY): Payer: Medicaid Other

## 2019-09-16 DIAGNOSIS — R6521 Severe sepsis with septic shock: Secondary | ICD-10-CM

## 2019-09-16 DIAGNOSIS — A419 Sepsis, unspecified organism: Secondary | ICD-10-CM | POA: Diagnosis not present

## 2019-09-16 DIAGNOSIS — L02419 Cutaneous abscess of limb, unspecified: Secondary | ICD-10-CM | POA: Diagnosis not present

## 2019-09-16 DIAGNOSIS — L03119 Cellulitis of unspecified part of limb: Secondary | ICD-10-CM | POA: Diagnosis not present

## 2019-09-16 DIAGNOSIS — G9340 Encephalopathy, unspecified: Secondary | ICD-10-CM

## 2019-09-16 LAB — URINE CULTURE: Culture: NO GROWTH

## 2019-09-16 LAB — BASIC METABOLIC PANEL
Anion gap: 15 (ref 5–15)
BUN: 37 mg/dL — ABNORMAL HIGH (ref 6–20)
CO2: 18 mmol/L — ABNORMAL LOW (ref 22–32)
Calcium: 7.6 mg/dL — ABNORMAL LOW (ref 8.9–10.3)
Chloride: 106 mmol/L (ref 98–111)
Creatinine, Ser: 2.05 mg/dL — ABNORMAL HIGH (ref 0.61–1.24)
GFR calc Af Amer: 42 mL/min — ABNORMAL LOW (ref >60)
GFR calc non Af Amer: 36 mL/min — ABNORMAL LOW (ref >60)
Glucose, Bld: 50 mg/dL — ABNORMAL LOW (ref 70–99)
Potassium: 4.6 mmol/L (ref 3.5–5.1)
Sodium: 139 mmol/L (ref 135–145)

## 2019-09-16 LAB — POCT I-STAT 7, (LYTES, BLD GAS, ICA,H+H)
Acid-base deficit: 7 mmol/L — ABNORMAL HIGH (ref 0.0–2.0)
Bicarbonate: 18.9 mmol/L — ABNORMAL LOW (ref 20.0–28.0)
Calcium, Ion: 1.05 mmol/L — ABNORMAL LOW (ref 1.15–1.40)
HCT: 36 % — ABNORMAL LOW (ref 39.0–52.0)
Hemoglobin: 12.2 g/dL — ABNORMAL LOW (ref 13.0–17.0)
O2 Saturation: 91 %
Patient temperature: 99.9
Potassium: 4.5 mmol/L (ref 3.5–5.1)
Sodium: 138 mmol/L (ref 135–145)
TCO2: 20 mmol/L — ABNORMAL LOW (ref 22–32)
pCO2 arterial: 39.2 mmHg (ref 32.0–48.0)
pH, Arterial: 7.294 — ABNORMAL LOW (ref 7.350–7.450)
pO2, Arterial: 71 mmHg — ABNORMAL LOW (ref 83.0–108.0)

## 2019-09-16 LAB — ECHOCARDIOGRAM LIMITED
Height: 73 in
Weight: 11584 oz

## 2019-09-16 LAB — CREATININE, URINE, RANDOM: Creatinine, Urine: 86.6 mg/dL

## 2019-09-16 LAB — SODIUM, URINE, RANDOM: Sodium, Ur: 45 mmol/L

## 2019-09-16 LAB — PHOSPHORUS: Phosphorus: 4.4 mg/dL (ref 2.5–4.6)

## 2019-09-16 LAB — MAGNESIUM: Magnesium: 1.3 mg/dL — ABNORMAL LOW (ref 1.7–2.4)

## 2019-09-16 LAB — CORTISOL-AM, BLOOD: Cortisol - AM: 30.3 ug/dL — ABNORMAL HIGH (ref 6.7–22.6)

## 2019-09-16 LAB — LACTIC ACID, PLASMA: Lactic Acid, Venous: 3.6 mmol/L (ref 0.5–1.9)

## 2019-09-16 MED ORDER — CALCIUM GLUCONATE-NACL 2-0.675 GM/100ML-% IV SOLN
2.0000 g | Freq: Once | INTRAVENOUS | Status: AC
  Administered 2019-09-16: 2000 mg via INTRAVENOUS
  Filled 2019-09-16: qty 100

## 2019-09-16 MED ORDER — MAGNESIUM SULFATE 2 GM/50ML IV SOLN
2.0000 g | Freq: Once | INTRAVENOUS | Status: AC
  Administered 2019-09-16: 2 g via INTRAVENOUS
  Filled 2019-09-16: qty 50

## 2019-09-16 MED ORDER — VANCOMYCIN HCL 10 G IV SOLR
2000.0000 mg | INTRAVENOUS | Status: DC
  Administered 2019-09-16: 2000 mg via INTRAVENOUS
  Filled 2019-09-16 (×2): qty 2000

## 2019-09-16 MED ORDER — PHENYLEPHRINE HCL-NACL 40-0.9 MG/250ML-% IV SOLN
0.0000 ug/min | INTRAVENOUS | Status: DC
  Administered 2019-09-16: 300 ug/min via INTRAVENOUS
  Administered 2019-09-16: 175 ug/min via INTRAVENOUS
  Administered 2019-09-16: 11:00:00 200 ug/min via INTRAVENOUS
  Administered 2019-09-16: 20 ug/min via INTRAVENOUS
  Administered 2019-09-16: 300 ug/min via INTRAVENOUS
  Administered 2019-09-17: 400 ug/min via INTRAVENOUS
  Filled 2019-09-16 (×11): qty 250

## 2019-09-16 MED ORDER — MORPHINE 100MG IN NS 100ML (1MG/ML) PREMIX INFUSION
5.0000 mg/h | INTRAVENOUS | Status: DC
  Administered 2019-09-16: 18:00:00 5 mg/h via INTRAVENOUS
  Administered 2019-09-17: 07:00:00 17 mg/h via INTRAVENOUS
  Filled 2019-09-16 (×3): qty 100

## 2019-09-16 MED ORDER — ALBUMIN HUMAN 5 % IV SOLN
25.0000 g | Freq: Once | INTRAVENOUS | Status: AC
  Administered 2019-09-16: 25 g via INTRAVENOUS
  Filled 2019-09-16: qty 500

## 2019-09-16 MED ORDER — ACETAMINOPHEN 10 MG/ML IV SOLN
1000.0000 mg | Freq: Once | INTRAVENOUS | Status: AC
  Administered 2019-09-16: 17:00:00 1000 mg via INTRAVENOUS
  Filled 2019-09-16: qty 100

## 2019-09-16 MED ORDER — MORPHINE SULFATE (PF) 2 MG/ML IV SOLN
2.0000 mg | INTRAVENOUS | Status: DC | PRN
  Administered 2019-09-16: 17:00:00 2 mg via INTRAVENOUS
  Filled 2019-09-16: qty 1

## 2019-09-16 NOTE — Progress Notes (Addendum)
[facility] Progress Note Patient Name: REMI LOPATA DOB: September 21, 1966 MRN: [id number]   Date of Service  09/16/2019  HPI/Events of Note  Hypotension despite Norepinephrine and Vasopressin infusions and 150 ml/ hour of crystalloid infusion  eICU Interventions  Albumin 5 % 500 ml iv bolus x 1, Phenylephrine infusion added. ABG and ionized calcium checks to r/o acidosis or hypocalcemia as contributing factors to the hypotension.        Kerry Kass Kaedynce Tapp 09/16/2019, 2:55 AM

## 2019-09-16 NOTE — Progress Notes (Signed)
Patient continues to thrash around in his bed.  Patient making loud noises and talking wildley to himself.  Patient continued to pull off his leads and scratch at his central line.  Mittens applied and the patient was educated regarding taking off medical devices.  MD ogen called several times.  NP Hoffman gave one time dose order for haldol which worked for 15 minutes.  Aline is working but dampened because patient has not been able to lay still yet.  No new interventions have been written at this time

## 2019-09-16 NOTE — Procedures (Signed)
Arterial Catheter Insertion Procedure Note COLEN ELTZROTH [id number] 05/28/1966  Procedure: Insertion of Arterial Catheter  Indications: Blood pressure monitoring and Frequent blood sampling  Procedure Details Consent: Unable to obtain consent because of emergent medical necessity. Time Out: Verified patient identification, verified procedure, site/side was marked, verified correct patient position, special equipment/implants available, medications/allergies/relevent history reviewed, required imaging and test results available.  Performed  Maximum sterile technique was used including antiseptics, cap, gloves, gown, hand hygiene, mask and sheet. Skin prep: Chlorhexidine; local anesthetic administered 20 gauge catheter was inserted into right radial artery using the Seldinger technique.  Evaluation Blood flow good; BP tracing good. Complications: No apparent complications.   Richardson Landry Minor ACNP Maryanna Shape PCCM Pager [phone] till 3 pm If no answer page [phone] 09/16/2019, 11:17 AM

## 2019-09-16 NOTE — Progress Notes (Signed)
NAME:  Albert Klein, MRN:  [id number], DOB:  05-29-1966, LOS: 2 ADMISSION DATE:  09/13/2019, CONSULTATION DATE:  09/15/2019 REFERRING MD:  Laural Benes, [facility], CHIEF COMPLAINT:  Shock  Brief History   53 yo M transferring from [facility] to [facility] with shock, likely septic shock in setting of suspected LLE cellulitis.   History of present illness   53 yo M PMH morbid obesity, recurrent lower extremity cellulitis, lymphedema, substance use disorder who presented to [facility]10 with CC LLE pain and edema after patient slipped in restroom after urinating on leg and sustained fall. Patient endorsed associated erythema, fevers, dizziness, lightheadedness. He endorsed difficulty with ambulation. Endorses loose stools but is unsure regarding chronicity. Denies blood in stool or hematemesis. Denied n/v/abdominal pain, chest pain, SOB, dysuria.  In [facility], Temp 98.6, HR 118, RR 37, BP 102/54, SpO2 94% on RA.  Lactic acid 6.4 WBC 8.0 H/H 13.5/43 plt `96 Na 133, K 3.8 Bicarb 20 BUN/Cr 23/2.38 CVC placed in [facility] due to difficult access. Admitted to hospitalist team with severe sepsis due to LLE cellulitis. He was started on broad spectrum abx, started on NE and given IVF.   11/11 patient with worsening hemodynamics and escalating NE requirement. Vasopressin added. Clindamycin added. Decision is made to pursue transfer to [facility] ICU, with PCCM to be primary team.   Past Medical History  Lymphedema Recurrent cellulitis Morbid obesity Asthma GERD Substance use disorder Degenerative joint disease  Chronic back pain  Significant Hospital Events   11/10 admitted to [facility]. Started on vanc cefepime. Given IVF and started on NE 11/11 Increasing NE requirement. Vaso added. Further volume resuscitation. Clindamycin added. Will transfer to [facility] ICU   Consults:  PCCM  Procedures:  CVC 11/10>>>   Significant Diagnostic Tests:  L tib/fib XR> generalized soft tissue swelling, no acute fx, no dislocation. Degenerative joint  changes of knee and ankle.   Micro Data:  11/11 MRSA> positive 11/11 UCx>>> negative 11/10 BCx >GPC >  11/10 SARS Cov2> negative   Antimicrobials:  11/10 vanc>> 11/10 cefepime>> 11/11 clindamycin x1  Interim history/subjective:  Aggressive hypotension overnight, now on norepinephrine, phenylephrine, vasopressin Deterioration in mental status last 18 hours, confusion and agitation treated with Haldol x2 since last exam   Objective   Blood pressure (!) 83/52, pulse (!) 115, temperature 98.9 F (37.2 C), temperature source Oral, resp. rate (!) 34, height 6\' 1"  (1.854 m), weight (!) 328.4 kg, SpO2 91 %.        Intake/Output Summary (Last 24 hours) at 09/16/2019 0834 Last data filed at 09/16/2019 0600 Gross per 24 hour  Intake 9214.74 ml  Output 3300 ml  Net 5914.74 ml   Filed Weights   09/21/2019 1547 09/16/19 0500  Weight: (!) 272.2 kg (!) 328.4 kg    Examination: General: Massively obese, uncomfortable in bed HENT: Large neck, dry oropharynx, no upper airway secretions, able to phonate Lungs: Very distant, difficult to hear anything, no overt wheezing Cardiovascular: Regular, tachycardic 109, distant, no murmur Abdomen: Obese, soft, nondistended, hypoactive bowel sounds Extremities: Bilateral lower extremity woody edema, chronic.  Erythema left foot, ankle extending proximally, more pale to mid thigh Neuro: Wakes to voice and stimulation but will not interact appropriately, repeating same words, will not answer questions, movement of his jaw is rhythmic, question dyskinetic GU: Foley catheter in place, intertriginous moisture and erythema  Resolved Hospital Problem list     Assessment & Plan:   Shock presumed septic, associated lactic acidosis.  Unclear that were getting  accurate blood pressure readings due to poorly functioning cuff, defective A-line waveform.  Source recurrent left lower extremity cellulitis, 1 of 2 GPC blood culture speciation pending -Vancomycin,  cefepime.  Received single dose clindamycin, no plans to restart at least at this time -No evidence gas-forming organism on lower extremity x-ray, follow progression/regression, may require surgical evaluation if progressing even on antibiotic therapy -IV fluid resuscitation -Follow lactate -Wean pressors as able but difficulty accurately measuring BP confounding -May need to place a new arterial catheter.  Unfortunately his body habitus will make this difficult.  I do not believe a femoral catheter would last due to his pannus -A.m. cortisol pending -Echocardiogram ordered and pending  Encephalopathy.  Large contribution toxic metabolic and sepsis.  He is now perseverating, appears to have dyskinetic movement of his jaw. -Continue to support and treat underlying infection, correct metabolic disarray -Stop Haldol given my concern for dyskinesia -Minimize sedating medications.  Question at some risk withdrawal, uses narcotics baseline  Acute renal failure with associated metabolic acidosis -Follow UOP, renal function.  Labs from 11/12 pending -Follow lactate for clearance -Urine sodium and creatinine ordered and pending  Hypomagnesemia  -replete 11/12  Acute (on chronic?) hypoxemic respiratory failure.  At high risk for decompensation, intubation both due to his labile mental status and airway protection, inability to compensate for progressive acidosis -Appears to be protecting his airway currently even with his encephalopathy -Follow ABG, follow SpO2 -If he requires intubation, suspect he will be heading for tracheostomy given his body habitus deconditioning  Diarrhea -Rectal tube removed -Stool frequency has decreased -GI panel ordered, yet to be collected  Chronic back pain  -home gabapentin, baclofen, oxycodone P -Minimize narcotics -Unable to take p.o. currently, not receiving gabapentin    Best practice:  Diet: NPO Pain/Anxiety/Delirium protocol (if indicated): na VAP  protocol (if indicated): na DVT prophylaxis: subQ heparin GI prophylaxis: protonix  Glucose control: monitor Mobility: BR Code Status: Full  Family Communication: I spoke with Lucile ShuttersAnn Silver, aunt.  She is his only family.  He lives with a friend named Gilford RaidJohn Cochran.  I explained the patient's current status, care plan, prognosis.  I explained that he has progressive organ system injuries and may not survive.  Given his chronic obesity and respiratory failure I explained that if he were to progress to overt respiratory failure and require intubation/mechanical ventilation then he would likely ultimately require tracheostomy, institutionalization and chronic ventilator support.  She has not had any specific conversations with the patient regarding GOC, but she believes that his general philosophy would find that kind of long-term support unacceptable.  Based on that I have recommended that we continue all medical support, including pressors, but defer intubation and mechanical ventilation, defer consideration for hemodialysis should he require it.  If at some point we felt his left lower extremity required surgical debridement or other invasive care then we would need to revisit with her, determine whether this was acceptable and consistent with his wishes. [facility] ICU    Labs   CBC: Recent Labs  Lab 09/12/2019 1737 09/15/19 0138 09/16/19 0335  WBC 8.0 11.5*  --   NEUTROABS 7.5  --   --   HGB 13.5 13.4 12.2*  HCT 43.3 43.1 36.0*  MCV 95.2 94.9  --   PLT 196 185  --     Basic Metabolic Panel: Recent Labs  Lab 09/06/2019 1737 09/15/19 0138 09/16/19 0335 09/16/19 0446  NA 133* 134* 138  --   K 3.8 3.9  4.5  --   CL 99 103  --   --   CO2 20* 22  --   --   GLUCOSE 85 75  --   --   BUN 23* 29*  --   --   CREATININE 2.38* 2.61*  --   --   CALCIUM 8.5* 8.0*  --   --   MG  --   --   --  1.3*  PHOS  --   --   --  4.4   GFR: Estimated Creatinine Clearance: 84 mL/min (A) (by C-G formula  based on SCr of 2.61 mg/dL (H)). Recent Labs  Lab 09-20-19 1737  09/15/19 0138 09/15/19 0637 09/15/19 1019 09/15/19 1332  WBC 8.0  --  11.5*  --   --   --   LATICACIDVEN 6.4*   < > 4.4* 4.3* 6.2* 5.4*   < > = values in this interval not displayed.    Liver Function Tests: Recent Labs  Lab 2019-09-20 1737  AST 41  ALT 21  ALKPHOS 51  BILITOT 1.2  PROT 6.2*  ALBUMIN 3.2*   No results for input(s): LIPASE, AMYLASE in the last 168 hours. No results for input(s): AMMONIA in the last 168 hours.  ABG    Component Value Date/Time   PHART 7.294 (L) 09/16/2019 0335   PCO2ART 39.2 09/16/2019 0335   PO2ART 71.0 (L) 09/16/2019 0335   HCO3 18.9 (L) 09/16/2019 0335   TCO2 20 (L) 09/16/2019 0335   ACIDBASEDEF 7.0 (H) 09/16/2019 0335   O2SAT 91.0 09/16/2019 0335     Coagulation Profile: Recent Labs  Lab 09-20-19 1737  INR 1.2    Cardiac Enzymes: Recent Labs  Lab 09/20/2019 1737  CKTOTAL 536*       Critical care time: 18 min      Baltazar Apo, MD, PhD 09/16/2019, 9:24 AM [facility] [phone] or if no answer [phone]

## 2019-09-16 NOTE — Progress Notes (Signed)
PCCM INterval Note  I spoke with pt's aunt Fransico Setters again this afternoon. Made her aware that the patient has declined, now with a more labored and inefficient respiratory pattern. Explained that I suspect that he will experience progressive failure and will likely not survive through to tomorrow. She understands. Unfortunately she is unable to visit. There are no other family to notify and Lelon Frohlich does not know the contact information for the patient's friend / roommate.   We will not escalate care. I would have low threshold to initiate comfort based meds if his respiratory pattern changes or shows distress.   Baltazar Apo, MD, PhD 09/16/2019, 4:14 PM [facility] [phone] or if no answer [phone]

## 2019-09-17 ENCOUNTER — Telehealth: Payer: Self-pay

## 2019-09-17 DIAGNOSIS — N179 Acute kidney failure, unspecified: Secondary | ICD-10-CM | POA: Diagnosis not present

## 2019-09-17 DIAGNOSIS — J9622 Acute and chronic respiratory failure with hypercapnia: Secondary | ICD-10-CM | POA: Diagnosis not present

## 2019-09-17 DIAGNOSIS — J9621 Acute and chronic respiratory failure with hypoxia: Secondary | ICD-10-CM

## 2019-09-17 LAB — CALCIUM, IONIZED: Calcium, Ionized, Serum: 4.1 mg/dL — ABNORMAL LOW (ref 4.5–5.6)

## 2019-09-17 LAB — COMPREHENSIVE METABOLIC PANEL
ALT: 61 U/L — ABNORMAL HIGH (ref 0–44)
AST: 154 U/L — ABNORMAL HIGH (ref 15–41)
Albumin: 2 g/dL — ABNORMAL LOW (ref 3.5–5.0)
Alkaline Phosphatase: 103 U/L (ref 38–126)
Anion gap: 12 (ref 5–15)
BUN: 49 mg/dL — ABNORMAL HIGH (ref 6–20)
CO2: 19 mmol/L — ABNORMAL LOW (ref 22–32)
Calcium: 7.2 mg/dL — ABNORMAL LOW (ref 8.9–10.3)
Chloride: 112 mmol/L — ABNORMAL HIGH (ref 98–111)
Creatinine, Ser: 3.1 mg/dL — ABNORMAL HIGH (ref 0.61–1.24)
GFR calc Af Amer: 25 mL/min — ABNORMAL LOW (ref >60)
GFR calc non Af Amer: 22 mL/min — ABNORMAL LOW (ref >60)
Glucose, Bld: 61 mg/dL — ABNORMAL LOW (ref 70–99)
Potassium: 4.9 mmol/L (ref 3.5–5.1)
Sodium: 143 mmol/L (ref 135–145)
Total Bilirubin: 1.8 mg/dL — ABNORMAL HIGH (ref 0.3–1.2)
Total Protein: 4.8 g/dL — ABNORMAL LOW (ref 6.5–8.1)

## 2019-09-17 LAB — CBC WITH DIFFERENTIAL/PLATELET
Abs Immature Granulocytes: 0.72 10*3/uL — ABNORMAL HIGH (ref 0.00–0.07)
Basophils Absolute: 0.2 10*3/uL — ABNORMAL HIGH (ref 0.0–0.1)
Basophils Relative: 1 %
Eosinophils Absolute: 0 10*3/uL (ref 0.0–0.5)
Eosinophils Relative: 0 %
HCT: 36.2 % — ABNORMAL LOW (ref 39.0–52.0)
Hemoglobin: 11 g/dL — ABNORMAL LOW (ref 13.0–17.0)
Immature Granulocytes: 4 %
Lymphocytes Relative: 7 %
Lymphs Abs: 1.3 10*3/uL (ref 0.7–4.0)
MCH: 29.8 pg (ref 26.0–34.0)
MCHC: 30.4 g/dL (ref 30.0–36.0)
MCV: 98.1 fL (ref 80.0–100.0)
Monocytes Absolute: 1.3 10*3/uL — ABNORMAL HIGH (ref 0.1–1.0)
Monocytes Relative: 6 %
Neutro Abs: 16.8 10*3/uL — ABNORMAL HIGH (ref 1.7–7.7)
Neutrophils Relative %: 82 %
Platelets: 110 10*3/uL — ABNORMAL LOW (ref 150–400)
RBC: 3.69 MIL/uL — ABNORMAL LOW (ref 4.22–5.81)
RDW: 15.8 % — ABNORMAL HIGH (ref 11.5–15.5)
WBC: 20.3 10*3/uL — ABNORMAL HIGH (ref 4.0–10.5)
nRBC: 0.3 % — ABNORMAL HIGH (ref 0.0–0.2)

## 2019-09-17 LAB — CULTURE, BLOOD (ROUTINE X 2): Special Requests: ADEQUATE

## 2019-09-17 MED ORDER — MUPIROCIN 2 % EX OINT: 1.0000 "application " | TOPICAL_OINTMENT | Freq: Two times a day (BID) | CUTANEOUS | Status: DC

## 2019-09-19 LAB — CULTURE, BLOOD (ROUTINE X 2)
Culture: NO GROWTH
Special Requests: ADEQUATE

## 2019-09-29 ENCOUNTER — Telehealth: Payer: Self-pay

## 2019-09-29 NOTE — Telephone Encounter (Signed)
Received dc from [facility].   DC is for cremation and a patient of Doctor Byrum.   DC will be taken to Pulmonary ([phone]) for signature.  On 10/18/2019 Received the original copy of the dc.  I called the funeral home to let them know that [place] with [employer] will be by to pickup the dc.

## 2019-10-05 NOTE — Progress Notes (Signed)
NAME:  Albert Klein, MRN:  [id number], DOB:  June 18, 1966, LOS: 3 ADMISSION DATE:  09/12/2019, CONSULTATION DATE:  09/15/2019 REFERRING MD:  Wynetta Emery, [facility], CHIEF COMPLAINT:  Shock  Brief History   53 yo M transferring [facility] to [facility] with shock, likely septic shock in setting of suspected LLE cellulitis.   History of present illness   53 yo M PMH morbid obesity, recurrent lower extremity cellulitis, lymphedema, substance use disorder who [facility] with CC LLE pain and edema after patient slipped in restroom after urinating on leg and sustained fall. Patient endorsed associated erythema, fevers, dizziness, lightheadedness. He endorsed difficulty with ambulation. Endorses loose stools but is unsure regarding chronicity. Denies blood in stool or hematemesis. Denied n/v/abdominal pain, chest pain, SOB, dysuria.  In [facility], Temp 98.6, HR 118, RR 37, BP 102/54, SpO2 94% on RA.  Lactic acid 6.4 WBC 8.0 H/H 13.5/43 plt `96 Na 133, K 3.8 Bicarb 20 BUN/Cr 23/2.38 CVC placed in [facility] due to difficult access. Admitted to hospitalist team with severe sepsis due to LLE cellulitis. He was started on broad spectrum abx, started on NE and given IVF.   11/11 patient with worsening hemodynamics and escalating NE requirement. Vasopressin added. Clindamycin added. Decision is made to pursue transfer to [facility] ICU, with PCCM to be primary team.   Past Medical History  Lymphedema Recurrent cellulitis Morbid obesity Asthma GERD Substance use disorder Degenerative joint disease  Chronic back pain  Significant Hospital Events   11/10 admitted to [facility]. Started on vanc cefepime. Given IVF and started on NE 11/11 Increasing NE requirement. Vaso added. Further volume resuscitation. Clindamycin added. Will transfer to [facility] ICU   Consults:  PCCM  Procedures:  CVC 11/10>>>   Significant Diagnostic Tests:  L tib/fib XR> generalized soft tissue swelling, no acute fx, no dislocation. Degenerative joint  changes of knee and ankle.   Micro Data:  11/11 MRSA> positive 11/11 UCx>>> negative 11/10 BCx >GPC >  11/10 SARS Cov2> negative   Antimicrobials:  11/10 vanc>> 11/10 cefepime>> 11/11 clindamycin x1  Interim history/subjective:  Progressive dyspnea and then increased work of breathing through the day yesterday 11/12.  Persistent shock.  Discussions undertaken with the patient's aunt, closest family regarding goals for care.  Added morphine for work of breathing.  Transition to comfort   Objective   Blood pressure (!) 91/50, pulse (!) 109, temperature (!) 103.3 F (39.6 C), temperature source Oral, resp. rate (!) 22, height 6\' 1"  (1.854 m), weight (!) 333.8 kg, SpO2 (!) 87 %.        Intake/Output Summary (Last 24 hours) at 10/09/2019 0814 Last data filed at 10/09/2019 0500 Gross per 24 hour  Intake 5342.78 ml  Output 600 ml  Net 4742.78 ml   Filed Weights   09/05/2019 1547 09/16/19 0500 10-09-19 0500  Weight: (!) 272.2 kg (!) 328.4 kg (!) 333.8 kg    Examination: Unchanged exam: General: Massively obese, uncomfortable in bed HENT: Large neck, dry oropharynx, no upper airway secretions, able to phonate Lungs: Very distant, difficult to hear anything, no overt wheezing.  Shallow respiratory pattern Cardiovascular: Regular, no murmur Abdomen: Obese, soft, nondistended, hypoactive bowel sounds Extremities: Bilateral lower extremity woody edema, chronic.  Erythema left foot, ankle extending proximally, more pale to mid thigh Neuro: Obtunded, morphine 17 mg/h GU: Foley catheter in place, intertriginous moisture and erythema  Resolved Hospital Problem list     Assessment & Plan:   Shock presumed septic, associated lactic acidosis.  Unclear that were getting accurate blood pressure readings due to poorly functioning cuff, defective A-line waveform.  Source recurrent left lower extremity cellulitis, 1 of 2 GPC blood culture speciation pending -Given transition to comfort  measures, progressive respiratory failure will stop his vancomycin, cefepime -No labs, no plans for any other aggressive interventions. -Echocardiogram canceled, poor windows  Encephalopathy.  Large contribution toxic metabolic and sepsis.  He is now perseverating, appears to have dyskinetic movement of his jaw. -Due to respiratory failure, metabolic disarray, now addition of morphine -Haldol discontinued  Acute renal failure with associated metabolic acidosis -Progressive on morning labs.  No plans to repeat  Acute (on chronic?) hypoxemic respiratory failure.  Based on discussions with his aunt patient would not want to have a chronic trach, be institutionalized.  Based on that we have agreed to transition to a comfort based approach -Continue morphine infusion and titrate based on his work of breathing  Diarrhea -Rectal tube removed  Chronic back pain  -home gabapentin, baclofen, oxycodone P -No enteral meds, managing with morphine    Best practice:  Diet: NPO Pain/Anxiety/Delirium protocol (if indicated): na VAP protocol (if indicated): na DVT prophylaxis: subQ heparin GI prophylaxis: protonix  Glucose control: monitor Mobility: BR Code Status: Full  Family Communication: As above have spoken with the patient's aunt Lucile Shuttersnn Silver.  There is no other family.  He has lived with a friend named John.  It is clear based on my conversations with her that patient is debilitated but would not want chronic dependent care, tracheostomy, institutionalization.  Based on this we agreed to defer intubation.  With his increased work of breathing we then transition to morphine for respiratory distress.  Currently in comfort mode   Labs   CBC: Recent Labs  Lab 09/18/2019 1737 09/15/19 0138 09/16/19 0335 2019-03-25 0500  WBC 8.0 11.5*  --  20.3*  NEUTROABS 7.5  --   --  16.8*  HGB 13.5 13.4 12.2* 11.0*  HCT 43.3 43.1 36.0* 36.2*  MCV 95.2 94.9  --  98.1  PLT 196 185  --  110*    Basic  Metabolic Panel: Recent Labs  Lab 09/22/2019 1737 09/15/19 0138 09/16/19 0335 09/16/19 0446 09/16/19 0929 2019-03-25 0500  NA 133* 134* 138  --  139 143  K 3.8 3.9 4.5  --  4.6 4.9  CL 99 103  --   --  106 112*  CO2 20* 22  --   --  18* 19*  GLUCOSE 85 75  --   --  50* 61*  BUN 23* 29*  --   --  37* 49*  CREATININE 2.38* 2.61*  --   --  2.05* 3.10*  CALCIUM 8.5* 8.0*  --   --  7.6* 7.2*  MG  --   --   --  1.3*  --   --   PHOS  --   --   --  4.4  --   --    GFR: Estimated Creatinine Clearance: 71.6 mL/min (A) (by C-G formula based on SCr of 3.1 mg/dL (H)). Recent Labs  Lab 09/10/2019 1737  09/15/19 0138 09/15/19 0637 09/15/19 1019 09/15/19 1332 09/16/19 0856 2019-03-25 0500  WBC 8.0  --  11.5*  --   --   --   --  20.3*  LATICACIDVEN 6.4*   < > 4.4* 4.3* 6.2* 5.4* 3.6*  --    < > = values in this interval not displayed.    Liver Function Tests: Recent  Labs  Lab 09/20/2019 1737 2019/10/17 0500  AST 41 154*  ALT 21 61*  ALKPHOS 51 103  BILITOT 1.2 1.8*  PROT 6.2* 4.8*  ALBUMIN 3.2* 2.0*   No results for input(s): LIPASE, AMYLASE in the last 168 hours. No results for input(s): AMMONIA in the last 168 hours.  ABG    Component Value Date/Time   PHART 7.294 (L) 09/16/2019 0335   PCO2ART 39.2 09/16/2019 0335   PO2ART 71.0 (L) 09/16/2019 0335   HCO3 18.9 (L) 09/16/2019 0335   TCO2 20 (L) 09/16/2019 0335   ACIDBASEDEF 7.0 (H) 09/16/2019 0335   O2SAT 91.0 09/16/2019 0335     Coagulation Profile: Recent Labs  Lab 09/30/2019 1737  INR 1.2    Cardiac Enzymes: Recent Labs  Lab 09/19/2019 1737  CKTOTAL 536*       Critical care time: N/A      Levy Pupa, MD, PhD 10/17/2019, 8:14 AM [facility] [phone] or if no answer [phone]

## 2019-10-05 NOTE — TOC Transition Note (Signed)
Transition of Care [facility]) - CM/SW Discharge Note   Patient Details  Name: Albert Klein MRN: [id number] Date of Birth: 20-Apr-1966  Transition of Care [facility]) CM/SW Contact:  Bartholomew Crews, RN Phone Number: [phone] 09/06/2019, 3:37 PM   Clinical Narrative:    Received call from ICU RN that patient had expired and needed to be transported to [facility], but family could not afford to pay for transport and patient is too large for morgue at hospital. Attempt to contact the emergency contact to confirm location for patient to be transported, left voice mail with NCM contact information. Attempt to contact [facility] at [phone] and left voice mail for [place] with NCM contact information. Spoke with AD about cost of transport who will make arrangements once able to confirm where patient is to go.    Final next level of care: Expired     Patient Goals and CMS Choice        Discharge Placement                       Discharge Plan and Services                                     Social Determinants of Health (SDOH) Interventions     Readmission Risk Interventions No flowsheet data found.

## 2019-10-05 NOTE — Telephone Encounter (Signed)
Received faxed dc from [facility].  Dc is for cremation and patient is a patient of Doctor Byrum.  The dc will be faxed to [phone] for signature.  On 10/02/2019 Received signed dc back from Doctor Byrum.  I called the funeral home to let them know I faxed the dc per their request.

## 2019-10-05 NOTE — Progress Notes (Signed)
CDS notified of patient's passing. Referral #[id number]. Patient can be released to funeral home. Bedside staff made aware.

## 2019-10-05 NOTE — TOC Transition Note (Signed)
Transition of Care [facility]) - CM/SW Discharge Note   Patient Details  Name: Albert Klein MRN: [id number] Date of Birth: 07-12-1966  Transition of Care [facility]) CM/SW Contact:  Bartholomew Crews, RN Phone Number: [phone] 08-Oct-2019, 4:44 PM   Clinical Narrative:    Damaris Schooner with Fransico Setters on the phone. She stated that [facility] in [place] will pick patient up in the morning. Telephone call to [facility] to verify arrangements. Advised that patient is currently located in [address]. PTAR contact information provided to [facility] who will arrange for transport in the morning. Georga Kaufmann, AD for care coordination aware of situation. ICU RN updated.    Final next level of care: Expired     Patient Goals and CMS Choice        Discharge Placement                       Discharge Plan and Services                                     Social Determinants of Health (SDOH) Interventions     Readmission Risk Interventions No flowsheet data found.

## 2019-10-05 DEATH — deceased

## 2019-10-19 DIAGNOSIS — G928 Other toxic encephalopathy: Secondary | ICD-10-CM | POA: Diagnosis present

## 2019-10-19 DIAGNOSIS — E8729 Other acidosis: Secondary | ICD-10-CM | POA: Diagnosis present

## 2019-10-19 DIAGNOSIS — E872 Acidosis: Secondary | ICD-10-CM | POA: Diagnosis present

## 2019-10-19 DIAGNOSIS — J9621 Acute and chronic respiratory failure with hypoxia: Secondary | ICD-10-CM | POA: Diagnosis not present

## 2019-10-19 DIAGNOSIS — G92 Toxic encephalopathy: Secondary | ICD-10-CM | POA: Diagnosis present

## 2019-11-05 NOTE — Death Summary Note (Signed)
  DEATH SUMMARY   Patient Details  Name: [name] MRN: [id number] DOB: [birth year]  Admission/Discharge Information   Admit Date:  10/14/19  Date of Death: Date of Death: 2019-10-17  Time of Death: Time of Death: 0844  Length of Stay: 3  Referring Physician: Lucia Gaskins, MD   Reason(s) for Hospitalization    Diagnoses  Preliminary cause of death:   Septic shock due to lower extremity cellulitis  Secondary Diagnoses (including complications and co-morbidities):  Principal Problem:   Septic shock ([place]) Active Problems:   Cellulitis and abscess of leg   ARF (acute renal failure) ([place])   Morbid obesity ([place])   Asthma   GERD (gastroesophageal reflux disease)   Polysubstance abuse ([place])   Anemia   Cellulitis of left lower extremity   Toxic metabolic encephalopathy   Acute on chronic respiratory failure with hypoxia ([place])   High anion gap metabolic acidosis   Brief Hospital Course (including significant findings, care, treatment, and services provided and events leading to death)  Albert Klein is a 54 y.o. year old male with morbid obesity, recurrent lower extremity cellulitis, lymphedema, substance use disorder. Presented to [facility] ED 2023/10/14 with LLE pain, edema, erythema, fevers, dizziness.  He was found to be in shock with acute renal failure and an anion gap metabolic acidosis, elevated lactate.  He was admitted with septic shock, started on broad-spectrum antibiotics, norepinephrine.  He received volume resuscitation via central IV catheter.  Despite antibiotic support he experienced worsening hemodynamics and vasopressin was added.  He was transferred to [facility] 11/11 for further care.  1 of 2 blood cultures grew coag negative staph, felt probably be a contaminant.  That being said he was covered adequately for for [facility] with antibiotics.  His blood pressure unfortunately failed to rebound.  He experienced evolving encephalopathy, dyskinetic movement of his jaw and  confusion.  This resulted in poor airway protection and evolving hypoxemia.  He showed evidence for progressive acute respiratory failure as well as progressive renal failure and persistent metabolic acidosis.  Discussions were undertaken with the patient's aunt, his only family.  She was clear that the patient would not want chronic dependent care, tracheostomy, ventilation.  For that reason we agreed to defer intubation even with his progressive respiratory failure.  He was started on morphine for respiratory distress and transition to full comfort mode.  He expired 10-17-19    Rose Fillers Steffen Hase 10/19/2019, 5:27 PM

## 2020-09-23 IMAGING — US US RENAL
1 series · 4 of 4 positions shown · non-contrast
Comparison: None.

CLINICAL DATA: Acute kidney injury.

EXAM:
RENAL / URINARY TRACT ULTRASOUND COMPLETE

[Series 1: us renal · 4 of 4 slices shown]
[im 1/4]
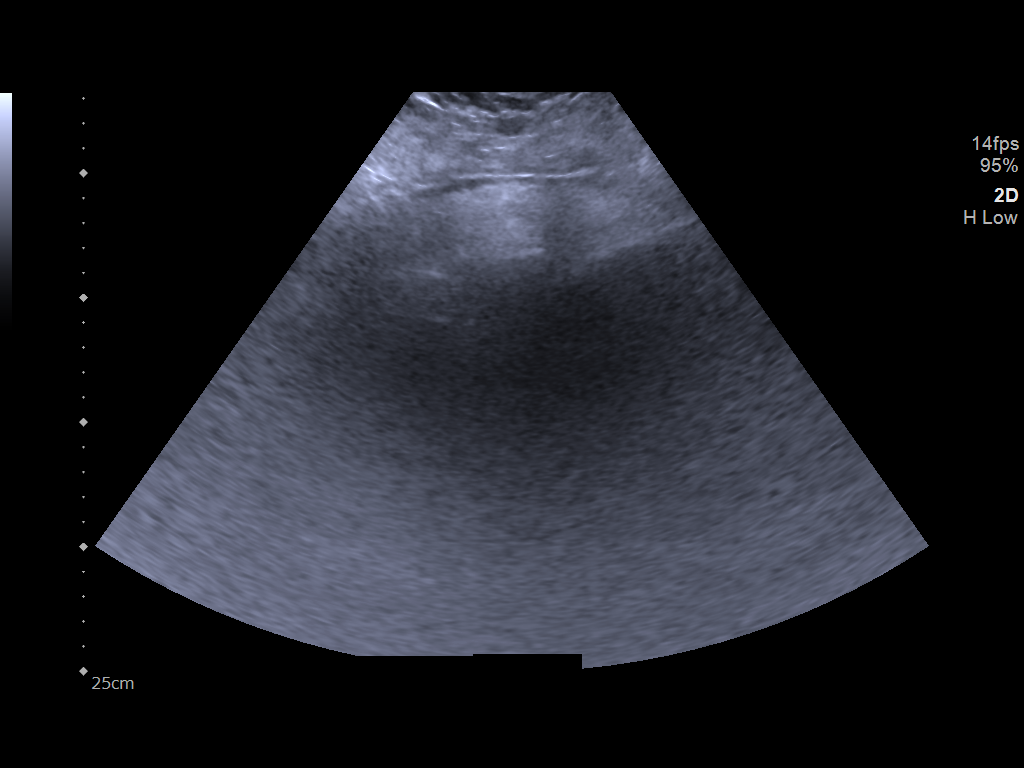
[im 2/4]
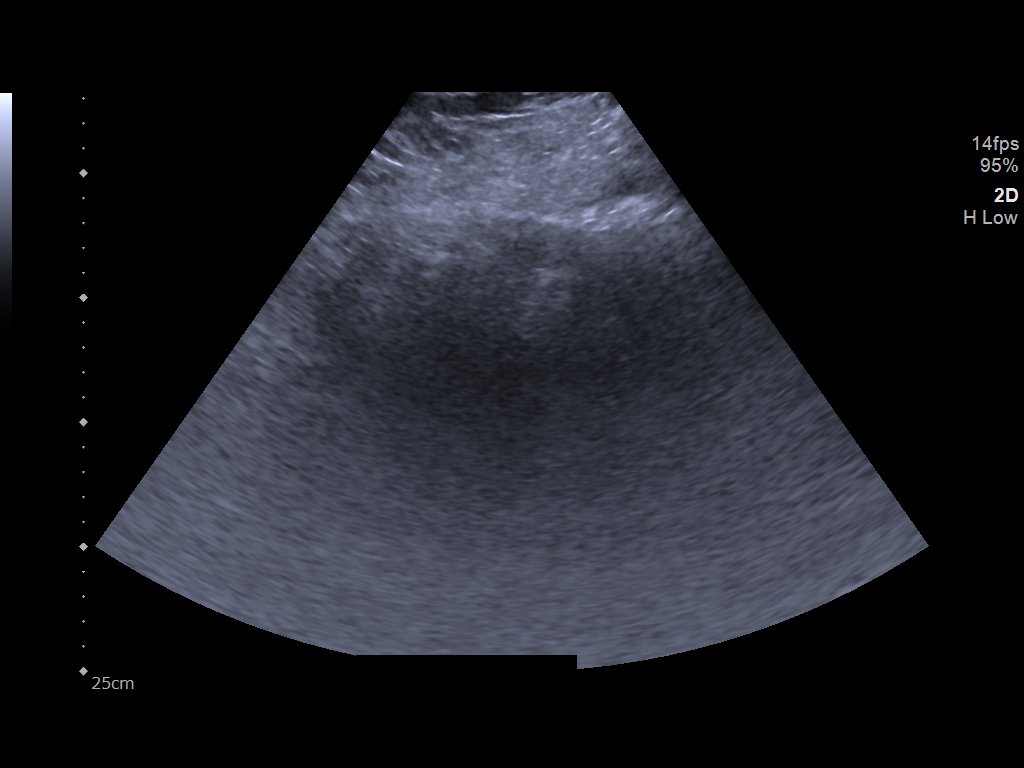
[im 3/4]
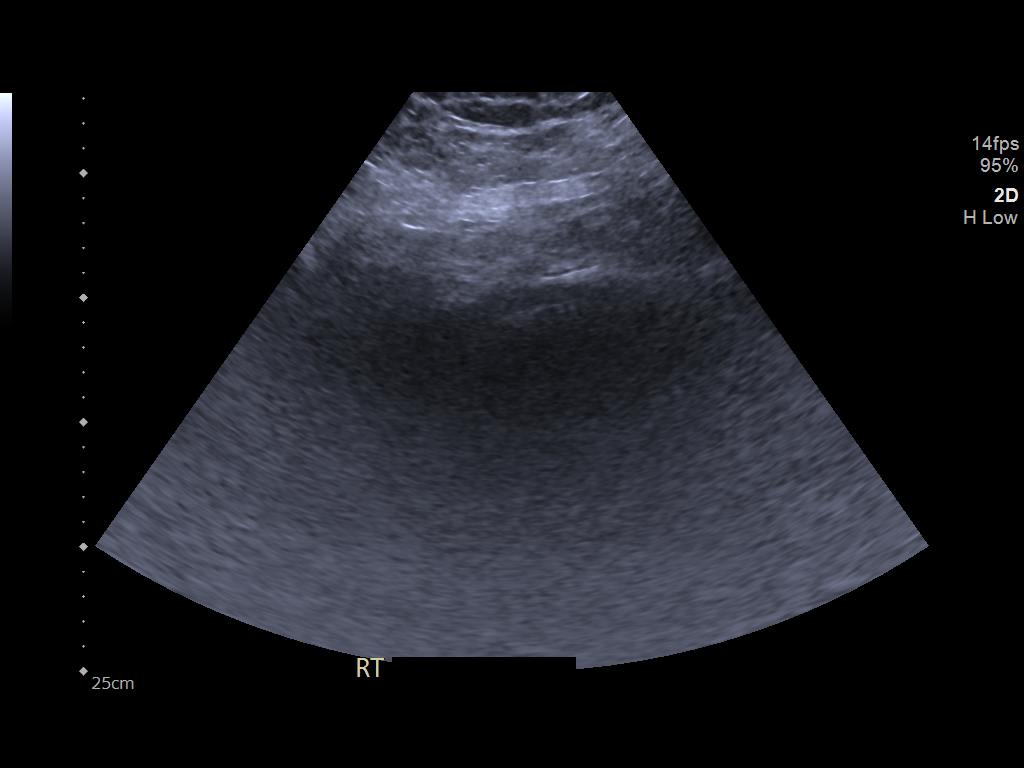
[im 4/4]
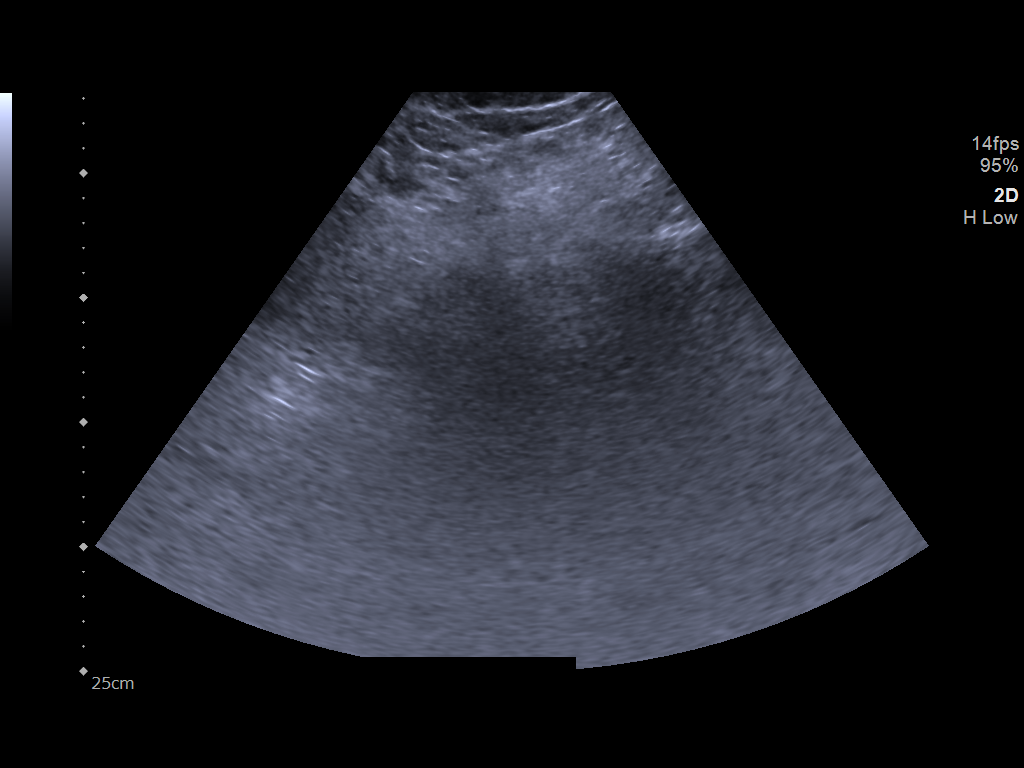

[4 of 4 positions shown; findings below may reference images not displayed]

FINDINGS: Neither kidney could be identified due to the patient's morbid
obesity and limited movement. The patient weighs 728 pounds. The
patient was unable to roll off of his left side for visualization of
the left kidney.
IMPRESSION: Nondiagnostic exam.  Neither kidney could be visualized.
